# Patient Record
Sex: Male | Born: 1955 | Race: Black or African American | Hispanic: No | Marital: Single | Smoking: Current every day smoker
Health system: Southern US, Community
[De-identification: ages and names within clinical notes are randomized; demographics above are authoritative.]

## PROBLEM LIST (undated history)

## (undated) DIAGNOSIS — S92309A Fracture of unspecified metatarsal bone(s), unspecified foot, initial encounter for closed fracture: Secondary | ICD-10-CM

## (undated) DIAGNOSIS — R350 Frequency of micturition: Secondary | ICD-10-CM

## (undated) DIAGNOSIS — I700 Atherosclerosis of aorta: Secondary | ICD-10-CM

## (undated) DIAGNOSIS — I10 Essential (primary) hypertension: Secondary | ICD-10-CM

## (undated) DIAGNOSIS — F329 Major depressive disorder, single episode, unspecified: Secondary | ICD-10-CM

## (undated) DIAGNOSIS — A159 Respiratory tuberculosis unspecified: Secondary | ICD-10-CM

## (undated) DIAGNOSIS — N400 Enlarged prostate without lower urinary tract symptoms: Secondary | ICD-10-CM

## (undated) DIAGNOSIS — M1990 Unspecified osteoarthritis, unspecified site: Secondary | ICD-10-CM

## (undated) DIAGNOSIS — F32A Depression, unspecified: Secondary | ICD-10-CM

## (undated) DIAGNOSIS — K7460 Unspecified cirrhosis of liver: Secondary | ICD-10-CM

## (undated) HISTORY — PX: OTHER SURGICAL HISTORY: SHX169

## (undated) HISTORY — DX: Essential (primary) hypertension: I10

## (undated) HISTORY — PX: NO PAST SURGERIES: SHX2092

## (undated) HISTORY — DX: Respiratory tuberculosis unspecified: A15.9

## (undated) HISTORY — DX: Major depressive disorder, single episode, unspecified: F32.9

## (undated) HISTORY — DX: Unspecified osteoarthritis, unspecified site: M19.90

## (undated) HISTORY — DX: Depression, unspecified: F32.A

## (undated) HISTORY — DX: Benign prostatic hyperplasia without lower urinary tract symptoms: N40.0

## (undated) HISTORY — DX: Frequency of micturition: R35.0

---

## 2004-04-05 ENCOUNTER — Emergency Department: Payer: Self-pay | Admitting: Emergency Medicine

## 2004-10-19 ENCOUNTER — Emergency Department: Payer: Self-pay | Admitting: Emergency Medicine

## 2004-10-19 ENCOUNTER — Other Ambulatory Visit: Payer: Self-pay

## 2004-10-20 ENCOUNTER — Emergency Department: Payer: Self-pay | Admitting: Emergency Medicine

## 2005-02-06 ENCOUNTER — Emergency Department: Payer: Self-pay | Admitting: Unknown Physician Specialty

## 2010-11-30 ENCOUNTER — Emergency Department: Payer: Self-pay | Admitting: Emergency Medicine

## 2015-07-22 ENCOUNTER — Encounter: Payer: Self-pay | Admitting: Urology

## 2015-07-22 ENCOUNTER — Ambulatory Visit (INDEPENDENT_AMBULATORY_CARE_PROVIDER_SITE_OTHER): Payer: Medicaid Other | Admitting: Urology

## 2015-07-22 VITALS — BP 126/75 | HR 112 | Ht 70.5 in | Wt 153.7 lb

## 2015-07-22 DIAGNOSIS — N401 Enlarged prostate with lower urinary tract symptoms: Secondary | ICD-10-CM | POA: Diagnosis not present

## 2015-07-22 DIAGNOSIS — N138 Other obstructive and reflux uropathy: Secondary | ICD-10-CM | POA: Insufficient documentation

## 2015-07-22 DIAGNOSIS — R350 Frequency of micturition: Secondary | ICD-10-CM | POA: Diagnosis not present

## 2015-07-22 DIAGNOSIS — N471 Phimosis: Secondary | ICD-10-CM | POA: Diagnosis not present

## 2015-07-22 LAB — URINALYSIS, COMPLETE
Bilirubin, UA: NEGATIVE
Glucose, UA: NEGATIVE
Ketones, UA: NEGATIVE
Nitrite, UA: NEGATIVE
PH UA: 5.5 (ref 5.0–7.5)
Protein, UA: NEGATIVE
RBC, UA: NEGATIVE
Specific Gravity, UA: <1.005 — ABNORMAL LOW (ref 1.005–1.030)
Urobilinogen, Ur: 0.2 mg/dL (ref 0.2–1.0)

## 2015-07-22 LAB — MICROSCOPIC EXAMINATION
Bacteria, UA: NONE SEEN
RBC, UA: NONE SEEN /hpf (ref 0–?)

## 2015-07-22 LAB — BLADDER SCAN AMB NON-IMAGING: Scan Result: 236

## 2015-07-22 MED ORDER — FINASTERIDE 5 MG PO TABS: 5.0000 mg | ORAL_TABLET | Freq: Every day | ORAL | Status: DC

## 2015-07-22 MED ORDER — TAMSULOSIN HCL 0.4 MG PO CAPS: 0.4000 mg | ORAL_CAPSULE | Freq: Every day | ORAL | Status: DC

## 2015-07-22 NOTE — Progress Notes (Signed)
07/22/2015 2:26 PM   Patrick [name] Nov 15, 1955 [id number]  Referring provider: No referring provider defined for this encounter.  Chief Complaint  Patient presents with  . Benign Prostatic Hypertrophy    referred by Dr. Moishe Spiceuhl MD  . Urinary Frequency    HPI: This is a 60 year old African-American male who is referred to us by his PCP, Dr. Moishe [name], for BPH with LUTS and urinary frequency.   [name] states that for the last 10 years he has been experiencing urinary frequency, urgency, dysuria, nocturia 4, postvoid dribbling, intermittency, hesitancy and straining to urinate.  He states that he has been on tamsulosin for several years. He stated the tamsulosin was effective at first, but now it is no longer relieving his urinary symptoms. He also admitted to having episodes of urinary retention 2-3 times over the last 10 years which required Foley catheter placement.  He is I PSS score today is 30/6. His UA today was unremarkable. His PVR was 236 mL.  He denied any recent gross hematuria, fever, chills, nausea or vomiting.      IPSS      07/22/15 1400       International Prostate Symptom Score   How often have you had the sensation of not emptying your bladder? Almost always     How often have you had to urinate less than every two hours? More than half the time     How often have you found you stopped and started again several times when you urinated? More than half the time     How often have you found it difficult to postpone urination? More than half the time     How often have you had a weak urinary stream? More than half the time     How often have you had to strain to start urination? Almost always     How many times did you typically get up at night to urinate? 4 Times     Total IPSS Score 30     Quality of Life due to urinary symptoms   If you were to spend the rest of your life with your urinary condition just the way it is now how would you feel about that? Terrible          Score:  1-7 Mild 8-19 Moderate 20-35 Severe   PMH: Past Medical History  Diagnosis Date  . Depression   . BPH (benign prostatic hyperplasia)   . Urinary frequency   . Arthritis   . HTN (hypertension)   . Tuberculosis     Surgical History: Past Surgical History  Procedure Laterality Date  . None      Home Medications:    Medication List       This list is accurate as of: 07/22/15  2:26 PM.  Always use your most recent med list.               ibuprofen 200 MG tablet  Commonly known as:  ADVIL,MOTRIN  Take 200 mg by mouth every 6 (six) hours as needed.     NON FORMULARY  Medication for Arthritis. Not sure of name.     tamsulosin 0.4 MG Caps capsule  Commonly known as:  FLOMAX  Take 0.4 mg by mouth.        Allergies: No Known Allergies  Family History: Family History  Problem Relation Age of Onset  . Hematuria    . Tuberculosis    . Kidney disease Neg  Hx   . Prostate cancer Neg Hx     Social History:  reports that he has been smoking.  He does not have any smokeless tobacco history on file. He reports that he drinks alcohol. He reports that he does not use illicit drugs.  ROS: UROLOGY Frequent Urination?: Yes Hard to postpone urination?: Yes Burning/pain with urination?: Yes Get up at night to urinate?: Yes Leakage of urine?: Yes Urine stream starts and stops?: Yes Trouble starting stream?: Yes Do you have to strain to urinate?: Yes Blood in urine?: No Urinary tract infection?: No Sexually transmitted disease?: No Injury to kidneys or bladder?: No Painful intercourse?: No Weak stream?: No Erection problems?: Yes Penile pain?: Yes  Gastrointestinal Nausea?: No Vomiting?: No Indigestion/heartburn?: No Diarrhea?: No Constipation?: No  Constitutional Fever: No Night sweats?: No Weight loss?: Yes Fatigue?: No  Skin Skin rash/lesions?: No Itching?: No  Eyes Blurred vision?: Yes Double vision?:  Yes  Ears/Nose/Throat Sore throat?: Yes Sinus problems?: No  Hematologic/Lymphatic Swollen glands?: No Easy bruising?: No  Cardiovascular Leg swelling?: No Chest pain?: No  Respiratory Cough?: No Shortness of breath?: No  Endocrine Excessive thirst?: No  Musculoskeletal Back pain?: No Joint pain?: Yes  Neurological Headaches?: Yes Dizziness?: No  Psychologic Depression?: Yes Anxiety?: No  Physical Exam: BP 126/75 mmHg  Pulse 112  Ht 5' 10.5" (1.791 m)  Wt 153 lb 11.2 oz (69.718 kg)  BMI 21.73 kg/m2  Constitutional: Well nourished. Alert and oriented, No acute distress. HEENT: [place] AT, moist mucus membranes. Trachea midline, no masses. Cardiovascular: No clubbing, cyanosis, or edema. Respiratory: Normal respiratory effort, no increased work of breathing. GI: Abdomen is soft, non tender, non distended, no abdominal masses. Liver and spleen not palpable.  No hernias appreciated.  Stool sample for occult testing is not indicated.   GU: No CVA tenderness.  No bladder fullness or masses.  Patient with uncircumcised phallus. Foreskin is retracted with some difficulty. Urethral meatus is patent.  No penile discharge. No penile lesions or rashes. Scrotum without lesions, cysts, rashes and/or edema.  Testicles are located scrotally bilaterally. No masses are appreciated in the testicles. Left and right epididymis are normal. Rectal: Patient with  normal sphincter tone. Anus and perineum without scarring or rashes. No rectal masses are appreciated. Prostate is approximately 70 grams, no nodules are appreciated. Seminal vesicles are normal. Skin: No rashes, bruises or suspicious lesions. Lymph: No cervical or inguinal adenopathy. Neurologic: Grossly intact, no focal deficits, moving all 4 extremities. Psychiatric: Normal mood and affect.  Laboratory Data:  Urinalysis Results for orders placed or performed in visit on 07/22/15  Microscopic Examination  Result Value Ref  Range   WBC, UA 6-10 (A) 0 -  5 /hpf   RBC, UA None seen 0 -  2 /hpf   Epithelial Cells (non renal) 0-10 0 - 10 /hpf   Bacteria, UA None seen None seen/Few  Urinalysis, Complete  Result Value Ref Range   Specific Gravity, UA <1.005 (L) 1.005 - 1.030   pH, UA 5.5 5.0 - 7.5   Color, UA Yellow Yellow   Appearance Ur Clear Clear   Leukocytes, UA 1+ (A) Negative   Protein, UA Negative Negative/Trace   Glucose, UA Negative Negative   Ketones, UA Negative Negative   RBC, UA Negative Negative   Bilirubin, UA Negative Negative   Urobilinogen, Ur 0.2 0.2 - 1.0 mg/dL   Nitrite, UA Negative Negative   Microscopic Examination See below:   BLADDER SCAN AMB NON-IMAGING  Result  Value Ref Range   Scan Result 236     Pertinent Imaging: Results for RITA, PROM (MRN [id number]) as of 07/22/2015 23:23  Ref. Range 07/22/2015 14:52  Scan Result Unknown 236    Assessment & Plan:    1. BPH (benign prostatic hyperplasia) with LUTS:   IPSS score is 30/6.  He will continue the tamsulosin 0.4 mg daily and I will add finasteride 5 mg daily.  He will have a repeat IPSS score and PVR  in 3 months if his PSA returns within normal limits.  - PSA  2. Urinary frequency:   Frequency may be due to the incomplete bladder emptying.  Finasteride is added to his tamsulosin. We will reevaluate in 3 months with I PSS score and PVR if his PSA returns within normal limits.  - Urinalysis, Complete  3. Phimosis:   Patient's foreskin was difficult to retract during the exam. When I did retract the foreskin, urine dribbled onto the floor. The phimosis may be the cause of the postvoid dribbling and may be taking his other urinary symptoms worse. He is desiring a circumcision at this time. I have explained the procedure, risks and what to expect post procedurally, such as pain, penile swelling, infection, bleeding, to the patient.  He voices his understanding and wishes to proceed with the circumcision.   Return for  schedule circumsicion.  These notes generated with voice recognition software. I apologize for typographical errors.  Patrick Cowboy, Patrick [name]  [facility] [address], [address] [place], [place] [address] [phone]

## 2015-07-23 LAB — PSA: Prostate Specific Ag, Serum: 0.4 ng/mL (ref 0.0–4.0)

## 2015-09-02 ENCOUNTER — Ambulatory Visit: Payer: Medicaid Other | Admitting: Urology

## 2015-10-07 ENCOUNTER — Ambulatory Visit: Payer: Medicaid Other | Admitting: Urology

## 2016-12-26 DIAGNOSIS — I700 Atherosclerosis of aorta: Secondary | ICD-10-CM

## 2016-12-26 HISTORY — DX: Atherosclerosis of aorta: I70.0

## 2017-01-16 ENCOUNTER — Ambulatory Visit
Admission: RE | Admit: 2017-01-16 | Discharge: 2017-01-16 | Disposition: A | Discharge status: Home / Self Care | Payer: Medicaid Other | Source: Ambulatory Visit | Attending: Gastroenterology | Admitting: Gastroenterology

## 2017-01-16 ENCOUNTER — Other Ambulatory Visit: Payer: Self-pay | Admitting: Gastroenterology

## 2017-01-16 DIAGNOSIS — R7989 Other specified abnormal findings of blood chemistry: Secondary | ICD-10-CM

## 2017-01-16 DIAGNOSIS — R140 Abdominal distension (gaseous): Secondary | ICD-10-CM

## 2017-01-16 DIAGNOSIS — R945 Abnormal results of liver function studies: Secondary | ICD-10-CM | POA: Diagnosis present

## 2017-01-16 DIAGNOSIS — K760 Fatty (change of) liver, not elsewhere classified: Secondary | ICD-10-CM | POA: Diagnosis not present

## 2017-01-16 DIAGNOSIS — I700 Atherosclerosis of aorta: Secondary | ICD-10-CM | POA: Diagnosis not present

## 2017-01-16 DIAGNOSIS — N329 Bladder disorder, unspecified: Secondary | ICD-10-CM | POA: Insufficient documentation

## 2017-01-16 DIAGNOSIS — R188 Other ascites: Secondary | ICD-10-CM | POA: Diagnosis not present

## 2017-01-16 DIAGNOSIS — R1084 Generalized abdominal pain: Secondary | ICD-10-CM

## 2017-01-16 LAB — POCT I-STAT CREATININE: CREATININE: 0.6 mg/dL — AB (ref 0.61–1.24)

## 2017-01-16 MED ORDER — IOPAMIDOL (ISOVUE-300) INJECTION 61%
100.0000 mL | Freq: Once | INTRAVENOUS | Status: AC | PRN
  Administered 2017-01-16: 100 mL via INTRAVENOUS

## 2017-02-10 ENCOUNTER — Ambulatory Visit (INDEPENDENT_AMBULATORY_CARE_PROVIDER_SITE_OTHER): Payer: Medicaid Other | Admitting: Urology

## 2017-02-10 ENCOUNTER — Ambulatory Visit: Payer: Self-pay | Admitting: Urology

## 2017-02-10 ENCOUNTER — Ambulatory Visit
Admission: RE | Admit: 2017-02-10 | Discharge: 2017-02-10 | Disposition: A | Discharge status: Home / Self Care | Payer: Medicaid Other | Source: Ambulatory Visit | Attending: Urology | Admitting: Urology

## 2017-02-10 ENCOUNTER — Encounter: Payer: Self-pay | Admitting: Urology

## 2017-02-10 VITALS — BP 144/72 | HR 88 | Ht 70.0 in | Wt 169.0 lb

## 2017-02-10 DIAGNOSIS — R310 Gross hematuria: Secondary | ICD-10-CM | POA: Diagnosis not present

## 2017-02-10 DIAGNOSIS — N3289 Other specified disorders of bladder: Secondary | ICD-10-CM | POA: Diagnosis not present

## 2017-02-10 DIAGNOSIS — N138 Other obstructive and reflux uropathy: Secondary | ICD-10-CM

## 2017-02-10 DIAGNOSIS — N401 Enlarged prostate with lower urinary tract symptoms: Secondary | ICD-10-CM | POA: Diagnosis present

## 2017-02-10 DIAGNOSIS — R339 Retention of urine, unspecified: Secondary | ICD-10-CM | POA: Diagnosis not present

## 2017-02-10 LAB — URINALYSIS, COMPLETE (UACMP) WITH MICROSCOPIC
Bacteria, UA: NONE SEEN
GLUCOSE, UA: NEGATIVE mg/dL
HGB URINE DIPSTICK: NEGATIVE
KETONES UR: NEGATIVE mg/dL
NITRITE: NEGATIVE
PROTEIN: NEGATIVE mg/dL
RBC / HPF: NONE SEEN RBC/hpf (ref 0–5)
Specific Gravity, Urine: 1.015 (ref 1.005–1.030)
pH: 7 (ref 5.0–8.0)

## 2017-02-10 MED ORDER — FINASTERIDE 5 MG PO TABS: 5.0000 mg | ORAL_TABLET | Freq: Every day | ORAL | 11 refills | Status: AC

## 2017-02-10 MED ORDER — TAMSULOSIN HCL 0.4 MG PO CAPS: 0.4000 mg | ORAL_CAPSULE | Freq: Every day | ORAL | 12 refills | Status: AC

## 2017-02-10 MED ORDER — FINASTERIDE 5 MG PO TABS: 5.0000 mg | ORAL_TABLET | Freq: Every day | ORAL | 11 refills | Status: DC

## 2017-02-10 NOTE — Progress Notes (Signed)
02/10/2017 12:59 PM   Patrick [name] 1955/11/11 [id number]  Referring provider: [facility] [address] [place], [place] [id number]  Chief Complaint  Patient presents with  . Benign Prostatic Hypertrophy    follow up    HPI: 61 year old male previously followed by urology who was referred back to our office for concerning findings on CT scan.   He was recently seen and evaluated by gastroenterology for elevated LFTs.  As part of this work up, he underwent CT abdomen with and without contrast which showed morphological changes consistent with cirrhosis.  Incidentally, the patient was found to have a markedly thickened bladder with 2 diverticula.  1 of these diverticula contained a nodular calcified lesion highly concerning for underlying mass.  He was previously seen in our clinic in 06/2015 by Patrick [name].  At that time, he was noted to have BPH with lower urinary tract symptoms.  He also had incomplete bladder emptying with an elevated postvoid residual of 236 mL.  He is also had episodes of urinary retention in the past.  He was started on tried in addition to the next.  Follow-up was scheduled for in 3 months but he failed to follow-up.  He also has a history of phimosis.  He reports today that he has significant voiding symptoms including a slow urinary stream and difficulty emptying his bladder.  IPSS as below.  He is no longer taking either of the medications, Flomax or finasteride.   He does note that he had an episode of painless gross hematuria yesterday.  No dysuria.  Most recent PSA 06/2015 0.4.  Catheterized PVR 200 cc.  Bladder scan unreliable due to cirrhosis.     IPSS    Row Name 02/10/17 1300         International Prostate Symptom Score   How often have you had the sensation of not emptying your bladder?  More than half the time     How often have you had to urinate less than every two hours?  Almost always     How often have you  found you stopped and started again several times when you urinated?  About half the time     How often have you found it difficult to postpone urination?  Almost always     How often have you had a weak urinary stream?  Almost always     How often have you had to strain to start urination?  Almost always     How many times did you typically get up at night to urinate?  4 Times     Total IPSS Score  31       Quality of Life due to urinary symptoms   If you were to spend the rest of your life with your urinary condition just the way it is now how would you feel about that?  Terrible        Score:  1-7 Mild 8-19 Moderate 20-35 Severe  PMH: Past Medical History:  Diagnosis Date  . Arthritis   . BPH (benign prostatic hyperplasia)   . Depression   . HTN (hypertension)   . Tuberculosis   . Urinary frequency     Surgical History: Past Surgical History:  Procedure Laterality Date  . None      Home Medications:  Allergies as of 02/10/2017   No Known Allergies     Medication List        Accurate as of 02/10/17 11:59  PM. Always use your most recent med list.          finasteride 5 MG tablet Commonly known as:  PROSCAR Take 1 tablet (5 mg total) daily by mouth.   pantoprazole 40 MG tablet Commonly known as:  PROTONIX Take by mouth.   tamsulosin 0.4 MG Caps capsule Commonly known as:  FLOMAX Take 1 capsule (0.4 mg total) daily after breakfast by mouth.       Allergies: No Known Allergies  Family History: Family History  Problem Relation Age of Onset  . Hematuria Unknown   . Tuberculosis Unknown   . Kidney disease Neg Hx   . Prostate cancer Neg Hx     Social History:  reports that he has been smoking.  he has never used smokeless tobacco. He reports that he drinks alcohol. He reports that he does not use drugs.  ROS: UROLOGY Frequent Urination?: Yes Hard to postpone urination?: Yes Burning/pain with urination?: Yes Get up at night to urinate?:  Yes Leakage of urine?: Yes Urine stream starts and stops?: Yes Trouble starting stream?: Yes Do you have to strain to urinate?: Yes Blood in urine?: Yes Urinary tract infection?: No Sexually transmitted disease?: No Injury to kidneys or bladder?: No Painful intercourse?: No Weak stream?: No Erection problems?: No Penile pain?: Yes  Gastrointestinal Nausea?: No Vomiting?: No Indigestion/heartburn?: Yes Diarrhea?: No Constipation?: Yes  Constitutional Fever: No Night sweats?: No Weight loss?: No Fatigue?: Yes  Skin Skin rash/lesions?: No Itching?: No  Eyes Blurred vision?: Yes Double vision?: No  Ears/Nose/Throat Sore throat?: No Sinus problems?: No  Hematologic/Lymphatic Swollen glands?: No Easy bruising?: No  Cardiovascular Leg swelling?: Yes Chest pain?: No  Respiratory Cough?: No Shortness of breath?: Yes  Endocrine Excessive thirst?: Yes  Musculoskeletal Back pain?: Yes Joint pain?: Yes  Neurological Headaches?: No Dizziness?: Yes  Psychologic Depression?: Yes Anxiety?: No  Physical Exam: BP (!) 144/72   Pulse 88   Ht 5\' 10"  (1.778 m)   Wt 169 lb (76.7 kg)   BMI 24.25 kg/m    Constitutional:  Alert and oriented, No acute distress.  Accompanied today by sister-in-law. HEENT: [place] AT, moist mucus membranes.  Trachea midline, no masses.  He is very hard of hearing.  Scleral icterus appreciated. Cardiovascular: No clubbing, cyanosis, or edema. Respiratory: Normal respiratory effort, no increased work of breathing. GI: Abdomen is soft, nontender, no abdominal masses mildly distended.. GU: No CVA tenderness.  No suprapubic tenderness. Rectal: Enlarged, 50 cc prostate, nontender, no nodules. Skin: No rashes, bruises or suspicious lesions. Neurologic: Grossly intact, no focal deficits, moving all 4 extremities. Psychiatric: Normal mood and affect.  Laboratory Data: Lab Results  Component Value Date   CREATININE 0.60 (L) 01/16/2017     Lab Results  Component Value Date   PSA1 0.4 07/22/2015   Urinalysis Pending  Pertinent Imaging: CLINICAL DATA:  62 year old male with history of abdominal pain common nausea, vomiting and abdominal distention. Elevated liver function tests.  EXAM: CT ABDOMEN AND PELVIS WITHOUT AND WITH CONTRAST  TECHNIQUE: Multidetector CT imaging of the abdomen and pelvis was performed following the standard protocol before and following the bolus administration of intravenous contrast.  CONTRAST:  ISOVUE-300 IOPAMIDOL (ISOVUE-300) INJECTION 61%  COMPARISON:  No priors.  FINDINGS: Comment: Today's study is limited by considerable patient respiratory motion.  Lower chest: Unremarkable.  Hepatobiliary: Mild diffuse low attenuation throughout the hepatic parenchyma, indicative of a background of hepatic steatosis. Accounting for today's motion limited examination, no definite suspicious cystic  or solid hepatic lesions are noted. The liver has a shrunken appearance and nodular contour, indicative of underlying cirrhosis. No intra or extrahepatic biliary ductal dilatation. Gallbladder is normal in appearance.  Pancreas: No pancreatic mass. No pancreatic ductal dilatation. No pancreatic or peripancreatic fluid or inflammatory changes.  Spleen: Unremarkable.  Adrenals/Urinary Tract: Bilateral kidneys and bilateral adrenal glands are normal in appearance. No hydroureteronephrosis. Urinary bladder is thick walled. Large diverticulum adjacent to the left ureterovesicular junction measuring up to 3.6 cm in diameter, with a small focus of mural thickening (axial image 125 of series 7) laterally. Small diverticulum in the superior aspect of the urinary bladder measuring 2.7 cm in diameter. Additionally, on the left side of the urinary bladder there is a small mural nodule measuring approximately 11 mm which has a small focus of calcification associated with it (axial  image 118 of series 7).  Stomach/Bowel: The appearance of the stomach is normal. There is no pathologic dilatation of small bowel or colon. The appendix is not confidently identified and may be surgically absent. Regardless, there are no inflammatory changes noted adjacent to the cecum to suggest the presence of an acute appendicitis at this time.  Vascular/Lymphatic: Aortic atherosclerosis, without evidence of aneurysm or dissection in the abdominal or pelvic vasculature. No lymphadenopathy noted in the abdomen or pelvis.  Reproductive: Severe median lobe hypertrophy of the prostate gland. Seminal vesicles are unremarkable in appearance.  Other: Small volume of ascites, most evident adjacent to the liver and spleen. No pneumoperitoneum.  Musculoskeletal: There are no aggressive appearing lytic or blastic lesions noted in the visualized portions of the skeleton.  IMPRESSION: 1. Morphologic changes in the liver compatible with underlying cirrhosis. There is also some mild hepatic steatosis. At this time, there is no discrete hepatic lesion identified. Additionally, there is no evidence of biliary tract obstruction. 2. Small volume of ascites. 3. Aortic atherosclerosis. 4. Markedly thickened urinary bladder, with 2 diverticulae and 1 focus of mural nodularity which appears to have some internal calcification, as above. Urologic consultation is recommended in the near future to better evaluate these findings, as a possibility of urothelial neoplasm is not excluded. Aortic Atherosclerosis (ICD10-I70.0).   Electronically Signed   By: Trudie Reedaniel  Entrikin M.D.   On: 01/16/2017 14:41  CT scan was personally reviewed today.    Assessment & Plan:    1. Bladder mass CT scan reviewed, highly concerning for underlying bladder malignancy Findings were reviewed with the patient and his sister-in-law  recommend cystoscopy for direct visualization  2. Gross hematuria Highly  concerning especially especially in setting of #1 UA today unremarkable  3. BPH with urinary obstruction Sequela of chronic outlet obstruction seen on CT scan as well as incomplete bladder emptying today Symptomatic Resume finasteride and Flomax - BLADDER SCAN AMB NON-IMAGING - Urinalysis, Complete w Microscopic; Future - tamsulosin (FLOMAX) 0.4 MG CAPS capsule; Take 1 capsule (0.4 mg total) daily after breakfast by mouth.  Dispense: 30 capsule; Refill: 12 - finasteride (PROSCAR) 5 MG tablet; Take 1 tablet (5 mg total) daily by mouth.  Dispense: 30 tablet; Refill: 11  4. Incomplete bladder emptying Catheterized for PVR today  Encourage timed double voiding  All the above is complicated today by the fact that he continues to abuse alcohol and has evidence of cirrhosis.  Is also failed to follow-up in the past as previously recommended and stopped taking his BPH meds.  We had a lengthy discussion today about compliance with follow-up as well as my concern for  underlying malignancy.  He understands this and is agreed to come back for cystoscopy as discussed.  Return to care in 2 weeks for cystoscopy   Vanna Scotland, MD  [facility] [address], [address] [place], [place] [address] [phone]  I spent 25 min with this patient of which greater than 50% was spent in counseling and coordination of care with the patient.

## 2017-02-10 NOTE — Progress Notes (Signed)
In and Out Catheterization  Patient is present today for a I & O catheterization due to unable to void. Patient was cleaned and prepped in a sterile fashion with betadine and Lidocaine 2% jelly was instilled into the urethra.  A 14FR coude cath was inserted no complications were noted , 200ml of urine return was noted, urine was dark orange in color. A clean urine sample was collected for UA. Bladder was drained  And catheter was removed with out difficulty.    Preformed by: Eligha BridegroomSarah Jacobb Alen, CMA

## 2017-02-23 ENCOUNTER — Other Ambulatory Visit: Payer: Self-pay

## 2017-02-23 DIAGNOSIS — N3289 Other specified disorders of bladder: Secondary | ICD-10-CM

## 2017-02-24 ENCOUNTER — Encounter: Payer: Self-pay | Admitting: Urology

## 2017-02-24 ENCOUNTER — Ambulatory Visit (INDEPENDENT_AMBULATORY_CARE_PROVIDER_SITE_OTHER): Payer: Medicaid Other | Admitting: Urology

## 2017-02-24 VITALS — BP 134/69 | HR 102 | Ht 70.0 in | Wt 164.0 lb

## 2017-02-24 DIAGNOSIS — N138 Other obstructive and reflux uropathy: Secondary | ICD-10-CM

## 2017-02-24 DIAGNOSIS — N3289 Other specified disorders of bladder: Secondary | ICD-10-CM

## 2017-02-24 DIAGNOSIS — N401 Enlarged prostate with lower urinary tract symptoms: Secondary | ICD-10-CM

## 2017-02-24 DIAGNOSIS — R310 Gross hematuria: Secondary | ICD-10-CM

## 2017-02-24 MED ORDER — CIPROFLOXACIN HCL 500 MG PO TABS
500.0000 mg | ORAL_TABLET | Freq: Once | ORAL | Status: AC
  Administered 2017-02-24: 500 mg via ORAL

## 2017-02-24 MED ORDER — LIDOCAINE HCL 2 % EX GEL
1.0000 "application " | Freq: Once | CUTANEOUS | Status: AC
  Administered 2017-02-24: 1 via URETHRAL

## 2017-02-24 NOTE — Progress Notes (Signed)
   02/24/17  CC:  Chief Complaint  Patient presents with  . Cysto    HPI: 61 year old male with history of alcoholism/cirrhosis found to have abnormal bladder on CT abdomen pelvis.  He is also had episodes of painless gross hematuria.  He presents today for cystoscopy for further evaluation of these bladder lesions.  Blood pressure 134/69, pulse (!) 102, height 5\' 10"  (1.778 m), weight 164 lb (74.4 kg). NED. A&Ox3.   No respiratory distress   Abd soft, NT, ND Normal phallus with bilateral descended testicles  Cystoscopy Procedure Note  Patient identification was confirmed, informed consent was obtained, and patient was prepped using Betadine solution.  Lidocaine jelly was administered per urethral meatus.    Preoperative abx where received prior to procedure.     Pre-Procedure: - Inspection reveals a normal caliber ureteral meatus.  Procedure: The flexible cystoscope was introduced without difficulty.  Initially, visualization was very poor due to a large amount of debris within the bladder requiring bladder drainage and refilling several times. - No urethral strictures/lesions are present. - Enlarged prostate with friable mucosa - Elevated bladder neck - Bilateral ureteral orifices identified - Bladder mucosa  reveals several ulcerated, erythematous  appearing concerning lesions on the posterior bladder wall, left and right bladder wall.  Again, visualization was somewhat limited today.  No obvious discrete papillary tumors were identified. - No bladder stones - Moderate trabeculation with diverticula.   Retroflexion shows intravesical protrusion of the median lobe which was irregular in appearance, hypovascular, edematous and multilobulated.   Post-Procedure: - Patient tolerated the procedure well  Assessment/ Plan:  1. Bladder mass Suspicious and concerning findings today on cystoscopy Recommend biopsy/TURBT in the operating room for further evaluation including  bilateral retrograde pyelogram to complete gross hematuria workup risk and benefits of the surgery were discussed in detail including risk of bleeding, infection, damage to surrounding structures, injury to bladder, amongst others All questions answered - ciprofloxacin (CIPRO) tablet 500 mg - lidocaine (XYLOCAINE) 2 % jelly 1 application  2. BPH with obstruction/lower urinary tract symptoms BPH with chronic outlet obstruction with sequela including incomplete bladder emptying and trabeculation with diverticula I recommended consideration of the time of  bladder surgery to reduce outlet depending on extent of bladder lesion Reviewed the risks of TURP including bleeding, infection, retrograde ejaculation, stress incontinence, need for overnight admission.  3. Gross hematuria As per #1   Vanna ScotlandAshley Arria Naim, MD

## 2017-03-02 ENCOUNTER — Other Ambulatory Visit: Payer: Self-pay | Admitting: Urology

## 2017-03-02 ENCOUNTER — Other Ambulatory Visit: Payer: Self-pay | Admitting: Radiology

## 2017-03-02 DIAGNOSIS — N401 Enlarged prostate with lower urinary tract symptoms: Secondary | ICD-10-CM

## 2017-03-02 DIAGNOSIS — N138 Other obstructive and reflux uropathy: Secondary | ICD-10-CM

## 2017-03-02 DIAGNOSIS — N3289 Other specified disorders of bladder: Secondary | ICD-10-CM

## 2017-03-02 DIAGNOSIS — R310 Gross hematuria: Secondary | ICD-10-CM

## 2017-03-08 ENCOUNTER — Encounter: Payer: Self-pay | Admitting: Emergency Medicine

## 2017-03-08 ENCOUNTER — Emergency Department: Payer: Medicaid Other

## 2017-03-08 ENCOUNTER — Encounter
Admission: RE | Admit: 2017-03-08 | Discharge: 2017-03-08 | Disposition: A | Discharge status: Home / Self Care | Payer: Medicaid Other | Source: Ambulatory Visit | Attending: Urology | Admitting: Urology

## 2017-03-08 ENCOUNTER — Other Ambulatory Visit: Payer: Self-pay

## 2017-03-08 ENCOUNTER — Encounter: Payer: Self-pay | Admitting: Anesthesiology

## 2017-03-08 ENCOUNTER — Inpatient Hospital Stay
Admission: EM | Admit: 2017-03-08 | Discharge: 2017-03-09 | DRG: 434 | Disposition: A | Discharge status: Home / Self Care | Payer: Medicaid Other | Attending: Internal Medicine | Admitting: Internal Medicine

## 2017-03-08 ENCOUNTER — Ambulatory Visit (INDEPENDENT_AMBULATORY_CARE_PROVIDER_SITE_OTHER): Payer: Medicaid Other

## 2017-03-08 VITALS — BP 131/76 | HR 94

## 2017-03-08 DIAGNOSIS — I10 Essential (primary) hypertension: Secondary | ICD-10-CM | POA: Insufficient documentation

## 2017-03-08 DIAGNOSIS — Z8611 Personal history of tuberculosis: Secondary | ICD-10-CM | POA: Diagnosis not present

## 2017-03-08 DIAGNOSIS — Z8249 Family history of ischemic heart disease and other diseases of the circulatory system: Secondary | ICD-10-CM | POA: Diagnosis not present

## 2017-03-08 DIAGNOSIS — F329 Major depressive disorder, single episode, unspecified: Secondary | ICD-10-CM | POA: Diagnosis present

## 2017-03-08 DIAGNOSIS — I700 Atherosclerosis of aorta: Secondary | ICD-10-CM | POA: Diagnosis present

## 2017-03-08 DIAGNOSIS — Z7983 Long term (current) use of bisphosphonates: Secondary | ICD-10-CM

## 2017-03-08 DIAGNOSIS — R945 Abnormal results of liver function studies: Secondary | ICD-10-CM | POA: Diagnosis not present

## 2017-03-08 DIAGNOSIS — R17 Unspecified jaundice: Secondary | ICD-10-CM | POA: Diagnosis present

## 2017-03-08 DIAGNOSIS — N329 Bladder disorder, unspecified: Secondary | ICD-10-CM | POA: Diagnosis present

## 2017-03-08 DIAGNOSIS — N401 Enlarged prostate with lower urinary tract symptoms: Secondary | ICD-10-CM | POA: Diagnosis present

## 2017-03-08 DIAGNOSIS — F17200 Nicotine dependence, unspecified, uncomplicated: Secondary | ICD-10-CM | POA: Diagnosis present

## 2017-03-08 DIAGNOSIS — K7031 Alcoholic cirrhosis of liver with ascites: Secondary | ICD-10-CM | POA: Diagnosis present

## 2017-03-08 DIAGNOSIS — N138 Other obstructive and reflux uropathy: Secondary | ICD-10-CM | POA: Diagnosis present

## 2017-03-08 DIAGNOSIS — Z79899 Other long term (current) drug therapy: Secondary | ICD-10-CM | POA: Diagnosis not present

## 2017-03-08 DIAGNOSIS — R109 Unspecified abdominal pain: Secondary | ICD-10-CM | POA: Diagnosis not present

## 2017-03-08 HISTORY — DX: Unspecified cirrhosis of liver: K74.60

## 2017-03-08 HISTORY — DX: Atherosclerosis of aorta: I70.0

## 2017-03-08 HISTORY — DX: Fracture of unspecified metatarsal bone(s), unspecified foot, initial encounter for closed fracture: S92.309A

## 2017-03-08 LAB — CBC
HCT: 38 % — ABNORMAL LOW (ref 40.0–52.0)
HCT: 37.8 % — ABNORMAL LOW (ref 40.0–52.0)
HEMOGLOBIN: 13.1 g/dL (ref 13.0–18.0)
Hemoglobin: 13.1 g/dL (ref 13.0–18.0)
MCH: 34.3 pg — ABNORMAL HIGH (ref 26.0–34.0)
MCH: 34.6 pg — AB (ref 26.0–34.0)
MCHC: 34.6 g/dL (ref 32.0–36.0)
MCHC: 34.7 g/dL (ref 32.0–36.0)
MCV: 99.4 fL (ref 80.0–100.0)
MCV: 99.7 fL (ref 80.0–100.0)
PLATELETS: 207 10*3/uL (ref 150–440)
Platelets: 209 10*3/uL (ref 150–440)
RBC: 3.82 MIL/uL — ABNORMAL LOW (ref 4.40–5.90)
RBC: 3.79 MIL/uL — ABNORMAL LOW (ref 4.40–5.90)
RDW: 15.2 % — AB (ref 11.5–14.5)
RDW: 15 % — AB (ref 11.5–14.5)
WBC: 8 10*3/uL (ref 3.8–10.6)
WBC: 7 10*3/uL (ref 3.8–10.6)

## 2017-03-08 LAB — COMPREHENSIVE METABOLIC PANEL
ALBUMIN: 2.7 g/dL — AB (ref 3.5–5.0)
ALK PHOS: 913 U/L — AB (ref 38–126)
ALT: 50 U/L (ref 17–63)
ANION GAP: 7 (ref 5–15)
AST: 93 U/L — AB (ref 15–41)
BUN: 11 mg/dL (ref 6–20)
CALCIUM: 8.8 mg/dL — AB (ref 8.9–10.3)
CO2: 25 mmol/L (ref 22–32)
Chloride: 99 mmol/L — ABNORMAL LOW (ref 101–111)
Creatinine, Ser: 0.55 mg/dL — ABNORMAL LOW (ref 0.61–1.24)
GFR calc Af Amer: >60 mL/min (ref >60)
GFR calc non Af Amer: >60 mL/min (ref >60)
GLUCOSE: 139 mg/dL — AB (ref 65–99)
Potassium: 3.7 mmol/L (ref 3.5–5.1)
SODIUM: 131 mmol/L — AB (ref 135–145)
Total Bilirubin: 6.3 mg/dL — ABNORMAL HIGH (ref 0.3–1.2)
Total Protein: 7.3 g/dL (ref 6.5–8.1)

## 2017-03-08 LAB — URINALYSIS, ROUTINE W REFLEX MICROSCOPIC
Bilirubin Urine: NEGATIVE
GLUCOSE, UA: NEGATIVE mg/dL
KETONES UR: NEGATIVE mg/dL
Nitrite: NEGATIVE
PH: 5 (ref 5.0–8.0)
Protein, ur: NEGATIVE mg/dL
SPECIFIC GRAVITY, URINE: 1.005 (ref 1.005–1.030)

## 2017-03-08 LAB — BODY FLUID CELL COUNT WITH DIFFERENTIAL
Eos, Fluid: 0 %
LYMPHS FL: 34 %
MONOCYTE-MACROPHAGE-SEROUS FLUID: 51 %
Neutrophil Count, Fluid: 15 %
Other Cells, Fluid: 0 %
WBC FLUID: 573 uL

## 2017-03-08 LAB — PROTIME-INR
INR: 1.14
INR: 1.14
PROTHROMBIN TIME: 14.5 s (ref 11.4–15.2)
PROTHROMBIN TIME: 14.5 s (ref 11.4–15.2)

## 2017-03-08 LAB — PROTEIN, PLEURAL OR PERITONEAL FLUID

## 2017-03-08 LAB — LIPASE, BLOOD: Lipase: 14 U/L (ref 11–51)

## 2017-03-08 LAB — GLUCOSE, PLEURAL OR PERITONEAL FLUID: Glucose, Fluid: 132 mg/dL

## 2017-03-08 MED ORDER — FINASTERIDE 5 MG PO TABS
5.0000 mg | ORAL_TABLET | Freq: Every day | ORAL | Status: DC
  Administered 2017-03-09: 5 mg via ORAL
  Filled 2017-03-08: qty 1

## 2017-03-08 MED ORDER — PANTOPRAZOLE SODIUM 40 MG PO TBEC
40.0000 mg | DELAYED_RELEASE_TABLET | Freq: Every day | ORAL | Status: DC
  Administered 2017-03-09: 40 mg via ORAL
  Filled 2017-03-08: qty 1

## 2017-03-08 MED ORDER — ONDANSETRON HCL 4 MG PO TABS: 4.0000 mg | ORAL_TABLET | Freq: Four times a day (QID) | ORAL | Status: DC | PRN

## 2017-03-08 MED ORDER — ENOXAPARIN SODIUM 40 MG/0.4ML ~~LOC~~ SOLN: 40.0000 mg | SUBCUTANEOUS | Status: DC

## 2017-03-08 MED ORDER — TAMSULOSIN HCL 0.4 MG PO CAPS
0.4000 mg | ORAL_CAPSULE | Freq: Every day | ORAL | Status: DC
  Administered 2017-03-09: 0.4 mg via ORAL
  Filled 2017-03-08: qty 1

## 2017-03-08 MED ORDER — ONDANSETRON HCL 4 MG/2ML IJ SOLN: 4.0000 mg | Freq: Four times a day (QID) | INTRAMUSCULAR | Status: DC | PRN

## 2017-03-08 MED ORDER — ALBUMIN HUMAN 25 % IV SOLN
12.5000 g | Freq: Once | INTRAVENOUS | Status: AC
  Administered 2017-03-08: 12.5 g via INTRAVENOUS
  Filled 2017-03-08: qty 50

## 2017-03-08 NOTE — ED Notes (Signed)
Called pharmacy to obtain albumin and advised that it was ready just someone needed to come and pick it up

## 2017-03-08 NOTE — ED Provider Notes (Signed)
[facility] Emergency Department Provider Note    None    (approximate)  I have reviewed the triage vital signs and the nursing notes.   HISTORY  Chief Complaint Abdominal Pain    HPI Patrick [name] is a 61 y.o. male with recent diagnosis of alcoholic cirrhosis of the liver presents to the ER due to worsening diffuse abdominal pain and distention.  States he is having difficulty breathing due to distended abdomen.  States his been swelling over the past several days.  States that he is not made any medication changes.  Denies any fevers.  No cough.  Patient was sent to the ER after is being seen in his urology clinic appointment for preop labs.  Denies any dysuria.  States that he still making urine.  Describes the pain is mild to moderate in severity  Past Medical History:  Diagnosis Date  . Aortic atherosclerosis (HCC) 12/2016  . Arthritis   . BPH (benign prostatic hyperplasia)   . Cirrhosis of liver (HCC)   . Depression   . HTN (hypertension)   . Metatarsal fracture    2nd toe of left foot  . Tuberculosis   . Urinary frequency    Family History  Problem Relation Age of Onset  . Hematuria Unknown   . Tuberculosis Unknown   . Diabetes Mother   . Hypertension Mother   . Kidney disease Neg Hx   . Prostate cancer Neg Hx    Past Surgical History:  Procedure Laterality Date  . NO PAST SURGERIES    . None     Patient Active Problem List   Diagnosis Date Noted  . BPH with obstruction/lower urinary tract symptoms 07/22/2015  . Urinary frequency 07/22/2015  . Phimosis 07/22/2015      Prior to Admission medications   Medication Sig Start Date End Date Taking? Authorizing Provider  finasteride (PROSCAR) 5 MG tablet Take 1 tablet (5 mg total) daily by mouth. 02/10/17   Vanna ScotlandBrandon, Ashley, MD  pantoprazole (PROTONIX) 40 MG tablet Take 40 mg by mouth daily.  01/16/17   [provider]  tamsulosin (FLOMAX) 0.4 MG CAPS capsule Take 1 capsule  (0.4 mg total) daily after breakfast by mouth. 02/10/17   Vanna ScotlandBrandon, Ashley, MD    Allergies Patient has no known allergies.    Social History Social History   Tobacco Use  . Smoking status: Current Every Day Smoker    Packs/day: 0.50  . Smokeless tobacco: Never Used  Substance Use Topics  . Alcohol use: Yes    Alcohol/week: 0.0 oz    Comment: daily use up until October 2018  . Drug use: No    Review of Systems Patient denies headaches, rhinorrhea, blurry vision, numbness, shortness of breath, chest pain, edema, cough, abdominal pain, nausea, vomiting, diarrhea, dysuria, fevers, rashes or hallucinations unless otherwise stated above in HPI. ____________________________________________   PHYSICAL EXAM:  VITAL SIGNS: Vitals:   03/08/17 2100 03/08/17 2130  BP: 139/72 135/82  Pulse: 86 90  Resp:    Temp:    SpO2: 100% 100%    Constitutional: Alert and oriented. in no acute distress. Eyes:scleral icterus  Head: Atraumatic. Nose: No congestion/rhinnorhea. Mouth/Throat: Mucous membranes are moist.   Neck: No stridor. Painless ROM.  Cardiovascular: Normal rate, regular rhythm. Grossly normal heart sounds.  Good peripheral circulation. Respiratory: Normal respiratory effort.  No retractions. Lungs CTAB. Gastrointestinal: Tense ascites with positive fluid wave. No abdominal bruits. No CVA tenderness. Genitourinary:  Musculoskeletal: No lower extremity  tenderness nor edema.  No joint effusions. Neurologic:  Normal speech and language. No gross focal neurologic deficits are appreciated. No facial droop Skin:  Skin is warm, dry and intact. No rash noted. Psychiatric: Mood and affect are normal. Speech and behavior are normal.  ____________________________________________   LABS (all labs ordered are listed, but only abnormal results are displayed)  Results for orders placed or performed during the hospital encounter of 03/08/17 (from the past 24 hour(s))  Lipase, blood      Status: None   Collection Time: 03/08/17  3:57 PM  Result Value Ref Range   Lipase 14 11 - 51 U/L  Comprehensive metabolic panel     Status: Abnormal   Collection Time: 03/08/17  3:57 PM  Result Value Ref Range   Sodium 131 (L) 135 - 145 mmol/L   Potassium 3.7 3.5 - 5.1 mmol/L   Chloride 99 (L) 101 - 111 mmol/L   CO2 25 22 - 32 mmol/L   Glucose, Bld 139 (H) 65 - 99 mg/dL   BUN 11 6 - 20 mg/dL   Creatinine, Ser 1.61 (L) 0.61 - 1.24 mg/dL   Calcium 8.8 (L) 8.9 - 10.3 mg/dL   Total Protein 7.3 6.5 - 8.1 g/dL   Albumin 2.7 (L) 3.5 - 5.0 g/dL   AST 93 (H) 15 - 41 U/L   ALT 50 17 - 63 U/L   Alkaline Phosphatase 913 (H) 38 - 126 U/L   Total Bilirubin 6.3 (H) 0.3 - 1.2 mg/dL   GFR calc non Af Amer >60 >60 mL/min   GFR calc Af Amer >60 >60 mL/min   Anion gap 7 5 - 15  CBC     Status: Abnormal   Collection Time: 03/08/17  3:57 PM  Result Value Ref Range   WBC 7.0 3.8 - 10.6 K/uL   RBC 3.79 (L) 4.40 - 5.90 MIL/uL   Hemoglobin 13.1 13.0 - 18.0 g/dL   HCT 09.6 (L) 04.5 - 40.9 %   MCV 99.7 80.0 - 100.0 fL   MCH 34.6 (H) 26.0 - 34.0 pg   MCHC 34.7 32.0 - 36.0 g/dL   RDW 81.1 (H) 91.4 - 78.2 %   Platelets 209 150 - 440 K/uL  Protime-INR     Status: None   Collection Time: 03/08/17  6:46 PM  Result Value Ref Range   Prothrombin Time 14.5 11.4 - 15.2 seconds   INR 1.14   Body fluid cell count with differential     Status: Abnormal   Collection Time: 03/08/17  7:40 PM  Result Value Ref Range   Fluid Type-FCT PERITONEAL    Color, Fluid YELLOW YELLOW   Appearance, Fluid CLOUDY (A) CLEAR   WBC, Fluid 573 cu mm   Neutrophil Count, Fluid 15 %   Lymphs, Fluid 34 %   Monocyte-Macrophage-Serous Fluid 51 %   Eos, Fluid 0 %   Other Cells, Fluid 0 %  Protein, pleural or peritoneal fluid     Status: None   Collection Time: 03/08/17  7:40 PM  Result Value Ref Range   Total protein, fluid <3.0 g/dL   Fluid Type-FTP PERITONEAL   Glucose, pleural or peritoneal fluid     Status: None    Collection Time: 03/08/17  7:40 PM  Result Value Ref Range   Glucose, Fluid 132 mg/dL   Fluid Type-FGLU peritoneal    ____________________________________________  __________________________________  RADIOLOGY  I personally reviewed all radiographic images ordered to evaluate for the above acute complaints  and reviewed radiology reports and findings.  These findings were personally discussed with the patient.  Please see medical record for radiology report.  ____________________________________________   PROCEDURES  Procedure(s) performed:  ABDOMINAL PARACENTESIS Date/Time: 03/08/2017 9:40 PM Performed by: Willy Eddyobinson, Tamas Suen, MD Authorized by: Willy Eddyobinson, Jessel Gettinger, MD  Consent: Verbal consent obtained. Consent given by: patient Patient understanding: patient states understanding of the procedure being performed Imaging studies: imaging studies available Patient identity confirmed: verbally with patient Time out: Immediately prior to procedure a "time out" was called to verify the correct patient, procedure, equipment, support staff and site/side marked as required. Preparation: Patient was prepped and draped in the usual sterile fashion. Local anesthesia used: yes Anesthesia: local infiltration  Anesthesia: Local anesthesia used: yes Local Anesthetic: lidocaine 1% without epinephrine Anesthetic total: 10 mL Patient tolerance: Patient tolerated the procedure well with no immediate complications Comments: 3.5L of cloudy ascitic fluids removed.       Critical Care performed: no ____________________________________________   INITIAL IMPRESSION / ASSESSMENT AND PLAN / ED COURSE  Pertinent labs & imaging results that were available during my care of the patient were reviewed by me and considered in my medical decision making (see chart for details).  DDX: BP, tense ascites, worsening liver failure, dehydration, hypoalbuminemia, alcohol abuse  Patrick [name] is a 61 y.o.  who presents to the ED with symptoms as described above.  Patient with symptoms most likely secondary to tense new onset ascites in the setting of his liver cirrhosis and worsening pain will perform paracentesis to evaluate for SBP and provide some form of therapeutic relief.  Blood work sent for the above differential shows no evidence of leukocytosis and the patient is afebrile.  Clinical Course as of Mar 08 2145  Wed Mar 08, 2017  1939 Anion gap: 7 [PR]  2023 Centesis completed.  Patient tolerated procedure well and states he feels significantly improved.  Abdomen is soft no longer tender.  Awaiting results of cell count.  [PR]  2123 I spoke with Dr. Adonis Hugueninolley awning of GI.  Based on the patient's relatively quick worsening of his ascites she has recommended observation in the hospital pending culture results as well as ultrasound to evaluate for Budd-Chiari and trending his enzymes.  Will speak with Dr. Anne HahnWillis for admission.  [PR]    Clinical Course User Index [PR] Willy Eddyobinson, Denney Shein, MD     ____________________________________________   FINAL CLINICAL IMPRESSION(S) / ED DIAGNOSES  Final diagnoses:  Elevated bilirubin  Ascites due to alcoholic cirrhosis (HCC)  Jaundice      NEW MEDICATIONS STARTED DURING THIS VISIT:  This SmartLink is deprecated. Use AVSMEDLIST instead to display the medication list for a patient.   Note:  This document was prepared using Dragon voice recognition software and may include unintentional dictation errors.    Willy Eddyobinson, Rosielee Corporan, MD 03/08/17 2147

## 2017-03-08 NOTE — ED Triage Notes (Signed)
Presents with abd distention and intermittent pain

## 2017-03-08 NOTE — Progress Notes (Signed)
Patient here today complaining of stomach discomfort and intermittent lower back pain, some fatigue and dizziness. Patient and caretaker state his stomach is swollen. Upon exam his stomach does appear distended (possible ascites?) and patient states he is very uncomfortable. Per Dr. Apolinar JunesBrandon patient told to follow up with PCP or ER for evaluation today. Patient states PCP cannot see will go to ER for evaluation.

## 2017-03-08 NOTE — Patient Instructions (Signed)
Your procedure is scheduled on: Monday, March 13, 2017 Report to Same Day Surgery on the 2nd floor in the [facility]. To find out your arrival time, please call [phone] between 1PM - 3PM on: Friday, March 10, 2017  REMEMBER: Instructions that are not followed completely may result in serious medical risk, up to and including death; or upon the discretion of your surgeon and anesthesiologist your surgery may need to be rescheduled.  Do not eat food after midnight the night before your procedure.  No gum chewing or hard candies.  You may however, drink CLEAR liquids up to 2 hours before you are scheduled to arrive at the hospital for your procedure.  Do not drink clear liquids within 2 hours of the start of your surgery.  Clear liquids include: - water  - apple juice without pulp - clear gatorade - black coffee or tea (Do NOT add anything to the coffee or tea) Do NOT drink anything that is not on this list.  No Alcohol for 24 hours before or after surgery.  No Smoking including e-cigarettes for 24 hours prior to surgery. No chewable tobacco products for at least 6 hours prior to surgery. No nicotine patches on the day of surgery.  Notify your doctor if there is any change in your medical condition (cold, fever, infection).  Do not wear jewelry, make-up, hairpins, clips or nail polish.  Do not wear lotions, powders, or perfumes. You may wear deodorant.  Do not shave 48 hours prior to surgery. Men may shave face and neck.  Contacts and dentures may not be worn into surgery.  Do not bring valuables to the hospital. [facility] not responsible for any belongings or valuables.   TAKE THESE MEDICATIONS THE MORNING OF SURGERY WITH A SIP OF WATER:  1.  PROTONIX (take one tablet the night before surgery and one tablet the morning of surgery) - helps to prevent nausea after surgery  NOW!  Stop Anti-inflammatories such as Advil, Aleve, Ibuprofen, Motrin, Naproxen,  Naprosyn, Goodie powder, or aspirin products. (May take Tylenol or Acetaminophen if needed.)  NOW!  Stop ANY OVER THE COUNTER supplements until after surgery.   If you are being discharged the day of surgery, you will not be allowed to drive home. You will need someone to drive you home and stay with you that night.   If you are taking public transportation, you will need to have a responsible adult to with you.  Please call the number above if you have any questions about these instructions.

## 2017-03-08 NOTE — ED Notes (Signed)
Pt c/o abd pain that started today and gradually got worse - denies N/V/D

## 2017-03-08 NOTE — Pre-Procedure Instructions (Signed)
Per anesthesia patient has abnormal EKG and requested medical clearance. [facility] to [facility] and left Amy a  Voice message.

## 2017-03-08 NOTE — ED Notes (Signed)
Patient transported to Ultrasound 

## 2017-03-08 NOTE — H&P (Signed)
[facility] [place] [facility]   PATIENT NAME: Patrick [name]    MR#:  [id number]  DATE OF BIRTH:  1955/05/13  DATE OF ADMISSION:  03/08/2017  PRIMARY CARE PHYSICIAN: [facility]   REQUESTING/REFERRING PHYSICIAN: Roxan Hockey, MD  CHIEF COMPLAINT:   Chief Complaint  Patient presents with  . Abdominal Pain    HISTORY OF PRESENT ILLNESS:  Patrick [name]  is a 61 y.o. male who presents with abdominal pain and distention.  Patient has known history of cirrhosis recently diagnosed, and had paracentesis performed in the ED with improvement in his symptoms.  GI was called by ED physician who felt that patient should be observed tonight so that they can see him in the morning given how quickly his ascites recurred and also due to his elevated bilirubin.  Hospitalist were called for admission  PAST MEDICAL HISTORY:   Past Medical History:  Diagnosis Date  . Aortic atherosclerosis (HCC) 12/2016  . Arthritis   . BPH (benign prostatic hyperplasia)   . Cirrhosis of liver (HCC)   . Depression   . HTN (hypertension)   . Metatarsal fracture    2nd toe of left foot  . Tuberculosis   . Urinary frequency     PAST SURGICAL HISTORY:   Past Surgical History:  Procedure Laterality Date  . NO PAST SURGERIES    . None      SOCIAL HISTORY:   Social History   Tobacco Use  . Smoking status: Current Every Day Smoker    Packs/day: 0.50  . Smokeless tobacco: Never Used  Substance Use Topics  . Alcohol use: Yes    Alcohol/week: 0.0 oz    Comment: daily use up until October 2018    FAMILY HISTORY:   Family History  Problem Relation Age of Onset  . Hematuria Unknown   . Tuberculosis Unknown   . Diabetes Mother   . Hypertension Mother   . Kidney disease Neg Hx   . Prostate cancer Neg Hx     DRUG ALLERGIES:  No Known Allergies  MEDICATIONS AT HOME:   Prior to Admission medications   Medication Sig Start Date End Date Taking?  Authorizing Provider  finasteride (PROSCAR) 5 MG tablet Take 1 tablet (5 mg total) daily by mouth. 02/10/17   Vanna Scotland, MD  pantoprazole (PROTONIX) 40 MG tablet Take 40 mg by mouth daily.  01/16/17   [provider]  tamsulosin (FLOMAX) 0.4 MG CAPS capsule Take 1 capsule (0.4 mg total) daily after breakfast by mouth. 02/10/17   Vanna Scotland, MD    REVIEW OF SYSTEMS:  Review of Systems  Constitutional: Negative for chills, fever, malaise/fatigue and weight loss.  HENT: Negative for ear pain, hearing loss and tinnitus.   Eyes: Negative for blurred vision, double vision, pain and redness.  Respiratory: Negative for cough, hemoptysis and shortness of breath.   Cardiovascular: Negative for chest pain, palpitations, orthopnea and leg swelling.  Gastrointestinal: Positive for abdominal pain. Negative for constipation, diarrhea, nausea and vomiting.  Genitourinary: Negative for dysuria, frequency and hematuria.  Musculoskeletal: Negative for back pain, joint pain and neck pain.  Skin:       No acne, rash, or lesions  Neurological: Negative for dizziness, tremors, focal weakness and weakness.  Endo/Heme/Allergies: Negative for polydipsia. Does not bruise/bleed easily.  Psychiatric/Behavioral: Negative for depression. The patient is not nervous/anxious and does not have insomnia.      VITAL SIGNS:   Vitals:   03/08/17 2000  03/08/17 2030 03/08/17 2100 03/08/17 2130  BP: 129/87 129/85 139/72 135/82  Pulse: 90 87 86 90  Resp:      Temp:      TempSrc:      SpO2: 100% 100% 100% 100%  Weight:      Height:       Wt Readings from Last 3 Encounters:  03/08/17 74.4 kg (164 lb)  03/08/17 74.4 kg (164 lb)  02/24/17 74.4 kg (164 lb)    PHYSICAL EXAMINATION:  Physical Exam  Vitals reviewed. Constitutional: He is oriented to person, place, and time. He appears well-developed and well-nourished. No distress.  HENT:  Head: Normocephalic and atraumatic.  Mouth/Throat:  Oropharynx is clear and moist.  Eyes: Conjunctivae and EOM are normal. Pupils are equal, round, and reactive to light. No scleral icterus.  Neck: Normal range of motion. Neck supple. No JVD present. No thyromegaly present.  Cardiovascular: Normal rate, regular rhythm and intact distal pulses. Exam reveals no gallop and no friction rub.  No murmur heard. Respiratory: Effort normal and breath sounds normal. No respiratory distress. He has no wheezes. He has no rales.  GI: Soft. Bowel sounds are normal. He exhibits distension. There is tenderness.  Musculoskeletal: Normal range of motion. He exhibits no edema.  No arthritis, no gout  Lymphadenopathy:    He has no cervical adenopathy.  Neurological: He is alert and oriented to person, place, and time. No cranial nerve deficit.  No dysarthria, no aphasia  Skin: Skin is warm and dry. No rash noted. No erythema.  Psychiatric: He has a normal mood and affect. His behavior is normal. Judgment and thought content normal.    LABORATORY PANEL:   CBC Recent Labs  Lab 03/08/17 1557  WBC 7.0  HGB 13.1  HCT 37.8*  PLT 209   ------------------------------------------------------------------------------------------------------------------  Chemistries  Recent Labs  Lab 03/08/17 1557  NA 131*  K 3.7  CL 99*  CO2 25  GLUCOSE 139*  BUN 11  CREATININE 0.55*  CALCIUM 8.8*  AST 93*  ALT 50  ALKPHOS 913*  BILITOT 6.3*   ------------------------------------------------------------------------------------------------------------------  Cardiac Enzymes No results for input(s): TROPONINI in the last 168 hours. ------------------------------------------------------------------------------------------------------------------  RADIOLOGY:  [place] Abdomen Limited Ruq  Result Date: 03/08/2017 CLINICAL DATA:  Elevated bilirubin. EXAM: ULTRASOUND ABDOMEN LIMITED RIGHT UPPER QUADRANT COMPARISON:  CT abdomen 01/16/2017. FINDINGS: Gallbladder:  Thick-walled gallbladder, up to 4.6 mm, without visible stones or sludge. Negative sonographic Murphy's sign. No pericholecystic fluid. Common bile duct: Diameter: Upper limits normal, 5.5 mm. Liver: Nodular contour of the liver, with increased echotexture, suggesting cirrhosis. No focal lesions. Portal vein is patent on color Doppler imaging with normal direction of blood flow towards the liver. Incidental note is made of moderate ascites. IMPRESSION: Findings consistent with cirrhosis of the liver, with probable secondary ascites. No gallstones, but thick-walled gallbladder, almost 5 mm. No biliary ductal dilatation. Electronically Signed   By: Elsie StainJohn T Curnes M.D.   On: 03/08/2017 18:48    EKG:   Orders placed or performed during the hospital encounter of 03/08/17  . EKG 12 lead  . EKG 12 lead    IMPRESSION AND PLAN:  Principal Problem:   Hyperbilirubinemia -GI consult, ultrasound ordered per their request Active Problems:   Alcoholic cirrhosis of liver with ascites (HCC) -paracentesis performed in the ED both for diagnosis of possible SBP, but also for therapeutic result.  Patient feels much better after this procedure   BPH with obstruction/lower urinary tract symptoms -continue home meds  All the records are reviewed and case discussed with ED provider. Management plans discussed with the patient and/or family.  DVT PROPHYLAXIS: SubQ lovenox  GI PROPHYLAXIS: None  ADMISSION STATUS: Inpatient  CODE STATUS: Full Code Status History    This patient does not have a recorded code status. Please follow your organizational policy for patients in this situation.      TOTAL TIME TAKING CARE OF THIS PATIENT: 45 minutes.   Zeidy Tayag FIELDING 03/08/2017, 10:19 PM  [facility]  Office  [phone]  CC: Primary care physician; [facility]  Note:  This document was prepared using [employer] voice recognition software and may include unintentional  dictation errors.

## 2017-03-09 ENCOUNTER — Inpatient Hospital Stay: Payer: Medicaid Other

## 2017-03-09 DIAGNOSIS — K7031 Alcoholic cirrhosis of liver with ascites: Principal | ICD-10-CM

## 2017-03-09 DIAGNOSIS — R945 Abnormal results of liver function studies: Secondary | ICD-10-CM

## 2017-03-09 LAB — IRON AND TIBC
IRON: 58 ug/dL (ref 45–182)
SATURATION RATIOS: 32 % (ref 17.9–39.5)
TIBC: 181 ug/dL — AB (ref 250–450)
UIBC: 123 ug/dL

## 2017-03-09 LAB — COMPREHENSIVE METABOLIC PANEL
ALBUMIN: 2.5 g/dL — AB (ref 3.5–5.0)
ALT: 44 U/L (ref 17–63)
AST: 88 U/L — AB (ref 15–41)
Alkaline Phosphatase: 923 U/L — ABNORMAL HIGH (ref 38–126)
Anion gap: 7 (ref 5–15)
BUN: 10 mg/dL (ref 6–20)
CHLORIDE: 102 mmol/L (ref 101–111)
CO2: 22 mmol/L (ref 22–32)
CREATININE: 0.68 mg/dL (ref 0.61–1.24)
Calcium: 8.5 mg/dL — ABNORMAL LOW (ref 8.9–10.3)
GFR calc Af Amer: >60 mL/min (ref >60)
GFR calc non Af Amer: >60 mL/min (ref >60)
GLUCOSE: 128 mg/dL — AB (ref 65–99)
Potassium: 3.7 mmol/L (ref 3.5–5.1)
SODIUM: 131 mmol/L — AB (ref 135–145)
Total Bilirubin: 5.9 mg/dL — ABNORMAL HIGH (ref 0.3–1.2)
Total Protein: 6.6 g/dL (ref 6.5–8.1)

## 2017-03-09 LAB — PATHOLOGIST SMEAR REVIEW

## 2017-03-09 LAB — CBC
HCT: 34.8 % — ABNORMAL LOW (ref 40.0–52.0)
Hemoglobin: 12.3 g/dL — ABNORMAL LOW (ref 13.0–18.0)
MCH: 34.8 pg — AB (ref 26.0–34.0)
MCHC: 35.2 g/dL (ref 32.0–36.0)
MCV: 98.9 fL (ref 80.0–100.0)
PLATELETS: 186 10*3/uL (ref 150–440)
RBC: 3.52 MIL/uL — AB (ref 4.40–5.90)
RDW: 15.5 % — AB (ref 11.5–14.5)
WBC: 6.7 10*3/uL (ref 3.8–10.6)

## 2017-03-09 LAB — BILIRUBIN, FRACTIONATED(TOT/DIR/INDIR)
Bilirubin, Direct: 3.3 mg/dL — ABNORMAL HIGH (ref 0.1–0.5)
Indirect Bilirubin: 2.6 mg/dL — ABNORMAL HIGH (ref 0.3–0.9)
Total Bilirubin: 5.9 mg/dL — ABNORMAL HIGH (ref 0.3–1.2)

## 2017-03-09 LAB — FERRITIN: Ferritin: 951 ng/mL — ABNORMAL HIGH (ref 24–336)

## 2017-03-09 LAB — GAMMA GT: GGT: 333 U/L — AB (ref 7–50)

## 2017-03-09 MED ORDER — GADOBENATE DIMEGLUMINE 529 MG/ML IV SOLN
15.0000 mL | Freq: Once | INTRAVENOUS | Status: AC | PRN
  Administered 2017-03-09: 15 mL via INTRAVENOUS

## 2017-03-09 MED ORDER — TRAMADOL HCL 50 MG PO TABS
50.0000 mg | ORAL_TABLET | Freq: Four times a day (QID) | ORAL | Status: DC | PRN
  Administered 2017-03-09: 50 mg via ORAL
  Filled 2017-03-09: qty 1

## 2017-03-09 MED ORDER — SPIRONOLACTONE 50 MG PO TABS: 50.0000 mg | ORAL_TABLET | Freq: Every day | ORAL | 1 refills | Status: AC

## 2017-03-09 MED ORDER — FUROSEMIDE 20 MG PO TABS: 40.0000 mg | ORAL_TABLET | Freq: Every day | ORAL | 1 refills | Status: DC

## 2017-03-09 MED ORDER — FUROSEMIDE 20 MG PO TABS
20.0000 mg | ORAL_TABLET | Freq: Every day | ORAL | Status: DC
  Administered 2017-03-09: 20 mg via ORAL
  Filled 2017-03-09: qty 1

## 2017-03-09 MED ORDER — SPIRONOLACTONE 25 MG PO TABS
50.0000 mg | ORAL_TABLET | Freq: Every day | ORAL | Status: DC
  Administered 2017-03-09: 50 mg via ORAL
  Filled 2017-03-09: qty 2

## 2017-03-09 NOTE — Discharge Summary (Signed)
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 Aortic atherosclerosis (HCC) 12/2016  . Arthritis   . BPH (benign prostatic hyperplasia)   . Cirrhosis of liver (HCC)   . Depression   . HTN (hypertension)   . Metatarsal fracture    2nd toe of left foot  . Tuberculosis   . Urinary frequency     HOSPITAL COURSE:   Patrick [name]  is a 61 y.o. male who presents with abdominal pain and distention.  Patient has known history of cirrhosis recently diagnosed, and had paracentesis performed in the ED with improvement in his symptoms  1.Hyperbilirubinemia -GI consult - ultrasound showed consistent with cirrhosis of the liver, with probable secondary ascites. No gallstones, but thick-walled gallbladder, almost 5 mm. No biliary ductal dilatation. -Bunch of GI liver disease workup labs have been sent by Dr. Tobi Bastos -  Patient will follow results with [facility] as outpatient -MRCP negative for any biliary obstruction.  2.  Alcoholic cirrhosis of liver with ascites (HCC)  -paracentesis performed in the ED both for diagnosis of possible SBP, but also for therapeutic result. Patient feels much better after this procedure  -Patient had about 3.5 L removed.   -Fluid is negative for SBP.  Patient is now on Lasix and spironolactone. -Also a lot better after fluid removal. -Advised alcohol abstinence -Was discussed with patient's brother in the room  3.  BPH with obstruction/lower urinary tract  symptoms -continue home meds  Overall doing better.  Will discharge to home.  Patient agreeable. CONSULTS OBTAINED:  Treatment Team:  Wyline Mood, MD  DRUG ALLERGIES:  No Known Allergies  DISCHARGE MEDICATIONS:   Allergies as of 03/09/2017   No Known Allergies     Medication List    TAKE these medications   finasteride 5 MG tablet Commonly known as:  PROSCAR Take 1 tablet (5 mg total) daily by mouth.   furosemide 20 MG tablet Commonly known as:  LASIX Take 2 tablets (40 mg total) by mouth daily. Start taking on:  03/10/2017   pantoprazole 40 MG tablet Commonly known as:  PROTONIX Take 40 mg by mouth daily.   spironolactone 50 MG tablet Commonly known as:  ALDACTONE Take 1 tablet (50 mg total) by mouth daily. Start taking on:  03/10/2017   tamsulosin 0.4 MG Caps capsule Commonly known as:  FLOMAX Take 1 capsule (0.4 mg total) daily after breakfast by mouth.       If you experience worsening of your admission symptoms, develop shortness of breath, life threatening emergency, suicidal or homicidal thoughts you must seek medical attention immediately by calling 911 or calling your MD immediately  if symptoms less severe.  You Must read complete instructions/literature along with all the possible adverse reactions/side effects for all the Medicines you take and that have been prescribed to you. Take any new Medicines after you have completely understood and accept all the possible adverse reactions/side effects.   Please note  You were cared for by a hospitalist during  your hospital stay. If you have any questions about your discharge medications or the care you received while you were in the hospital after you are discharged, you can call the unit and asked to speak with the hospitalist on call if the hospitalist that took care of you is not available. Once you are discharged, your primary care physician will handle any further medical issues. Please note that NO REFILLS  for any discharge medications will be authorized once you are discharged, as it is imperative that you return to your primary care physician (or establish a relationship with a primary care physician if you do not have one) for your aftercare needs so that they can reassess your need for medications and monitor your lab values. Today   SUBJECTIVE   Doing well VITAL SIGNS:  Blood pressure (!) 117/59, pulse 99, temperature 99 F (37.2 C), temperature source Oral, resp. rate 20, height 5\' 10"  (1.778 m), weight 80 kg (176 lb 6.4 oz), SpO2 95 %.  I/O:    Intake/Output Summary (Last 24 hours) at 03/09/2017 1332 Last data filed at 03/09/2017 1228 Gross per 24 hour  Intake 50 ml  Output 4300 ml  Net -4250 ml    PHYSICAL EXAMINATION:  GENERAL:  61 y.o.-year-old patient lying in the bed with no acute distress.  EYES: Pupils equal, round, reactive to light and accommodation. No scleral icterus. Extraocular muscles intact.  HEENT: Head atraumatic, normocephalic. Oropharynx and nasopharynx clear.  NECK:  Supple, no jugular venous distention. No thyroid enlargement, no tenderness.  LUNGS: Normal breath sounds bilaterally, no wheezing, rales,rhonchi or crepitation. No use of accessory muscles of respiration.  CARDIOVASCULAR: S1, S2 normal. No murmurs, rubs, or gallops.  ABDOMEN: Soft, non-tender, distended. Bowel sounds present. No organomegaly or mass.  EXTREMITIES: No pedal edema, cyanosis, or clubbing.  NEUROLOGIC: Cranial nerves II through XII are intact. Muscle strength 5/5 in all extremities. Sensation intact. Gait not checked.  PSYCHIATRIC: The patient is alert and oriented x 3.  SKIN: No obvious rash, lesion, or ulcer.   DATA REVIEW:   CBC  Recent Labs  Lab 03/09/17 0348  WBC 6.7  HGB 12.3*  HCT 34.8*  PLT 186    Chemistries  Recent Labs  Lab 03/09/17 0348 03/09/17 0933  NA 131*  --   K 3.7  --   CL 102  --   CO2 22  --   GLUCOSE 128*  --   BUN 10  --   CREATININE  0.68  --   CALCIUM 8.5*  --   AST 88*  --   ALT 44  --   ALKPHOS 923*  --   BILITOT 5.9* 5.9*    Microbiology Results   Recent Results (from the past 240 hour(s))  Body fluid culture     Status: None (Preliminary result)   Collection Time: 03/08/17  7:40 PM  Result Value Ref Range Status   Specimen Description PERITONEAL  Final   Special Requests NONE  Final   Gram Stain   Final    RARE WBC PRESENT, PREDOMINANTLY MONONUCLEAR NO ORGANISMS SEEN Performed at [facility], [address] [place], [place] [address]    Culture PENDING  Incomplete   Report Status PENDING  Incomplete    RADIOLOGY:  Mr Liver W Wo Contrast  Result Date: 03/09/2017 CLINICAL DATA:  Abnormal liver function tests. Recently diagnosed with cirrhosis. Recent paracentesis. EXAM: MRI ABDOMEN WITHOUT AND WITH CONTRAST TECHNIQUE: Multiplanar multisequence MR imaging of the abdomen  was performed both before and after the administration of intravenous contrast. CONTRAST:  15 cc MultiHance COMPARISON:  03/08/2017 abdominal ultrasound.  01/16/2017 CT FINDINGS: Mild to moderate motion degradation throughout. Lower chest: Normal heart size without pericardial or pleural effusion. Hepatobiliary: Hepatomegaly, 20.0 cm craniocaudal. No significant steatosis. No definite evidence of cirrhosis. Normal gallbladder, without biliary ductal dilatation. Pancreas:  Normal, without mass or ductal dilatation. Spleen:  Normal in size, without focal abnormality. Adrenals/Urinary Tract: Normal adrenal glands. Normal kidneys, without hydronephrosis. Stomach/Bowel: Normal stomach and abdominal bowel loops. Vascular/Lymphatic: Aortic and branch vessel atherosclerosis. Patent portal and splenic veins. Other: Small volume ascites, relatively similar to 01/16/2017. Anasarca. Musculoskeletal: No acute osseous abnormality. IMPRESSION: 1. Motion degradation. 2. Hepatomegaly. No definite explanation for elevated liver function tests. 3. Small volume  ascites, similar. Electronically Signed   By: Jeronimo GreavesKyle  Talbot M.D.   On: 03/09/2017 12:10   [place] Abdomen Limited Ruq  Result Date: 03/08/2017 CLINICAL DATA:  Elevated bilirubin. EXAM: ULTRASOUND ABDOMEN LIMITED RIGHT UPPER QUADRANT COMPARISON:  CT abdomen 01/16/2017. FINDINGS: Gallbladder: Thick-walled gallbladder, up to 4.6 mm, without visible stones or sludge. Negative sonographic Murphy's sign. No pericholecystic fluid. Common bile duct: Diameter: Upper limits normal, 5.5 mm. Liver: Nodular contour of the liver, with increased echotexture, suggesting cirrhosis. No focal lesions. Portal vein is patent on color Doppler imaging with normal direction of blood flow towards the liver. Incidental note is made of moderate ascites. IMPRESSION: Findings consistent with cirrhosis of the liver, with probable secondary ascites. No gallstones, but thick-walled gallbladder, almost 5 mm. No biliary ductal dilatation. Electronically Signed   By: Elsie StainJohn T Curnes M.D.   On: 03/08/2017 18:48     Management plans discussed with the patient, family and they are in agreement.  CODE STATUS:     Code Status Orders  (From admission, onward)        Start     Ordered   03/08/17 2325  Full code  Continuous     03/08/17 2324    Code Status History    Date Active Date Inactive Code Status Order ID Comments User Context   This patient has a current code status but no historical code status.      TOTAL TIME TAKING CARE OF THIS PATIENT: *40* minutes.    Enedina FinnerSona Marquies Wanat M.D on 03/09/2017 at 1:32 PM  Between 7am to 6pm - Pager - [phone] After 6pm go to [email or web address] - Social research officer, governmentpassword [email or web address]  [facility]  Office  [phone]  CC: Primary care physician; [facility]

## 2017-03-09 NOTE — Progress Notes (Signed)
Patrick [name]  A and O x 4. VSS. Pt tolerating diet well. No complaints of pain or nausea. IV removed intact, prescriptions given. Pt voiced understanding of discharge instructions with no further questions. Pt discharged via wheelchair with RN.  Orvil FeilAbbie Naelle Diegel MSN, RN-BC  Allergies as of 03/09/2017   No Known Allergies     Medication List    TAKE these medications   finasteride 5 MG tablet Commonly known as:  PROSCAR Take 1 tablet (5 mg total) daily by mouth.   furosemide 20 MG tablet Commonly known as:  LASIX Take 2 tablets (40 mg total) by mouth daily. Start taking on:  03/10/2017   pantoprazole 40 MG tablet Commonly known as:  PROTONIX Take 40 mg by mouth daily.   spironolactone 50 MG tablet Commonly known as:  ALDACTONE Take 1 tablet (50 mg total) by mouth daily. Start taking on:  03/10/2017   tamsulosin 0.4 MG Caps capsule Commonly known as:  FLOMAX Take 1 capsule (0.4 mg total) daily after breakfast by mouth.       Vitals:   03/09/17 1226 03/09/17 1557  BP: (!) 117/59   Pulse: 99 (!) 109  Resp:    Temp:    SpO2:  95%

## 2017-03-09 NOTE — Progress Notes (Signed)
Per Dr. Enedina FinnerSona Patel okay to place order for 2 gram sodium diet following return from MRI

## 2017-03-09 NOTE — Progress Notes (Signed)
Per Dr. Allena KatzPatel okay to place order for Tramadol 50mg  q 6hours PRN

## 2017-03-09 NOTE — Consult Note (Signed)
Wyline Mood , MD [address], [address], [place], [place], [address] [address], [address], [place], [place], [address] Phone: [phone]  Fax: [phone]  Consultation  Referring Provider: Dr Anne Hahn Primary Care Physician:  [facility] Primary Gastroenterologist:  [facility]          Reason for Consultation:     Ascites  Date of Admission:  03/08/2017 Date of Consultation:  03/09/2017         HPI:   Patrick [name] is a 61 y.o. male who has been a patient of [facility].  Last seen them on 01/16/2017.  At the time that he was seen in the clinic he had some epigastric pain and bloating but was also on ibuprofen.  He had been consuming large quantities of alcohol.  He was noted to have elevated liver function tests obstructive pattern.  At that point of time he had an episode of hematemesis and had been scheduled for an EGD.  CT scan of the abdomen was obtained on 01/16/2017 and features suggestive of cirrhosis were noted.  Small volume of ascites were noted.  Markedly thickened urinary bladder with 2 diverticuli and focus of mural nodularity were also noted.  It appears that the clinic did try to get in touch with him but were unsuccessful.  He was subsequently seen by urology on 02/10/2017.  There is concern for underlying mass in the urinary bladder.   On this admission he was admitted on 03/08/2017 with abdominal pain and distention.  On admission his hemoglobin is 13.1 g with a platelet count of 209.  Sodium of 131 creatinine of 0.55 AST of 93 ALT of 50 total bilirubin of 6.3 and alkaline phosphatase of 913.  Serum albumin of 2.5 g.  An ultrasound of the abdomen performed yesterday showed features of cirrhosis with probable secondary ascites the gallbladder wall almost 5 mm no biliary ductal dilation seen.  He underwent a paracentesis yesterday with 573 white cell count seen in the acetic fluid but only a 15% neutrophil count which does not meet criteria for  SBP.  INR is 1.14.  Total protein in the fluid was less than 3.  I do not see albumin the ascites fluid being checked.  Culture so far is negative.He says he has drank a lot of alcohol for many years, cut down significantly recently, he was not quantifying the amount clearly despite my persistence. He has experimented with cocaine many years back. Denies any abdominal pain presently  Past Medical History:  Diagnosis Date  . Aortic atherosclerosis (HCC) 12/2016  . Arthritis   . BPH (benign prostatic hyperplasia)   . Cirrhosis of liver (HCC)   . Depression   . HTN (hypertension)   . Metatarsal fracture    2nd toe of left foot  . Tuberculosis   . Urinary frequency     Past Surgical History:  Procedure Laterality Date  . NO PAST SURGERIES    . None      Prior to Admission medications   Medication Sig Start Date End Date Taking? Authorizing Provider  finasteride (PROSCAR) 5 MG tablet Take 1 tablet (5 mg total) daily by mouth. 02/10/17  Yes Vanna [place], MD  pantoprazole (PROTONIX) 40 MG tablet Take 40 mg by mouth daily.  01/16/17   [provider]  tamsulosin (FLOMAX) 0.4 MG CAPS capsule Take 1 capsule (0.4 mg total) daily after breakfast by mouth. 02/10/17   Vanna [place], MD    Family History  Problem Relation Age of Onset  . Hematuria Unknown   . Tuberculosis Unknown   . Diabetes Mother   . Hypertension Mother   . Kidney disease Neg Hx   . Prostate cancer Neg Hx      Social History   Tobacco Use  . Smoking status: Current Every Day Smoker    Packs/day: 0.50  . Smokeless tobacco: Never Used  Substance Use Topics  . Alcohol use: Yes    Alcohol/week: 0.0 oz    Comment: daily use up until October 2018  . Drug use: No    Allergies as of 03/08/2017  . (No Known Allergies)    Review of Systems:    All systems reviewed and negative except where noted in HPI.   Physical Exam:  Vital signs in last 24 hours: Temp:  [98 F (36.7 C)-99 F (37.2 C)] 99 F  (37.2 C) (12/13 0550) Pulse Rate:  [83-102] 102 (12/13 0550) Resp:  [18-20] 20 (12/13 0550) BP: (96-143)/(60-89) 96/60 (12/13 0550) SpO2:  [95 %-100 %] 95 % (12/13 0550) Weight:  [164 lb (74.4 kg)-176 lb 6.4 oz (80 kg)] 176 lb 6.4 oz (80 kg) (12/12 2319) Last BM Date: 03/08/17 General:   Pleasant, cooperative in NAD Head:  Normocephalic and atraumatic. Eyes:   No icterus.   Conjunctiva pink. PERRLA. Ears:  Normal auditory acuity. Neck:  Supple; no masses or thyroidomegaly Lungs: Respirations even and unlabored. Lungs clear to auscultation bilaterally.   No wheezes, crackles, or rhonchi.  Heart:  Regular rate and rhythm;  Without murmur, clicks, rubs or gallops Abdomen:  Distended , non tender, tense ascitic fluid with palpable thrill. .  No rebound or guarding.  Neurologic:  Alert and oriented x3;  grossly normal neurologically. Skin:  Intact without significant lesions or rashes. Cervical Nodes:  No significant cervical adenopathy. Psych:  Alert and cooperative. Normal affect.  LAB RESULTS: Recent Labs    03/08/17 1412 03/08/17 1557 03/09/17 0348  WBC 8.0 7.0 6.7  HGB 13.1 13.1 12.3*  HCT 38.0* 37.8* 34.8*  PLT 207 209 186   BMET Recent Labs    03/08/17 1557 03/09/17 0348  NA 131* 131*  K 3.7 3.7  CL 99* 102  CO2 25 22  GLUCOSE 139* 128*  BUN 11 10  CREATININE 0.55* 0.68  CALCIUM 8.8* 8.5*   LFT Recent Labs    03/09/17 0348  PROT 6.6  ALBUMIN 2.5*  AST 88*  ALT 44  ALKPHOS 923*  BILITOT 5.9*   PT/INR Recent Labs    03/08/17 1412 03/08/17 1846  LABPROT 14.5 14.5  INR 1.14 1.14    STUDIES: [place] Abdomen Limited Ruq  Result Date: 03/08/2017 CLINICAL DATA:  Elevated bilirubin. EXAM: ULTRASOUND ABDOMEN LIMITED RIGHT UPPER QUADRANT COMPARISON:  CT abdomen 01/16/2017. FINDINGS: Gallbladder: Thick-walled gallbladder, up to 4.6 mm, without visible stones or sludge. Negative sonographic Murphy's sign. No pericholecystic fluid. Common bile duct: Diameter:  Upper limits normal, 5.5 mm. Liver: Nodular contour of the liver, with increased echotexture, suggesting cirrhosis. No focal lesions. Portal vein is patent on color Doppler imaging with normal direction of blood flow towards the liver. Incidental note is made of moderate ascites. IMPRESSION: Findings consistent with cirrhosis of the liver, with probable secondary ascites. No gallstones, but thick-walled gallbladder, almost 5 mm. No biliary ductal dilatation. Electronically Signed   By: Elsie StainJohn T Curnes M.D.   On: 03/08/2017 18:48      Impression / Plan:   Patrick [name] is a 61 y.o. y/o  male with decompensated alcoholic liver cirrhosis with ascites.  No evidence of SBP based on fluid analysis.  Incomplete analysis of ascites fluid due to absence of abdomen from the ascites fluid.  Possibly due to portal hypertension.  He does have features of cholestasis function tests.  Plan  1.  No large bile duct obstruction noted but small bile duct obstruction could account for his abnormal liver tests and hence would suggest an MRCP.IF portal vein thrombosis can be ruled out with MRCP then no doppler will be needed .   2.  I will also order tests for viral hepatitis as well as autoimmune hepatitis which he would have to follow-up as an outpatient with cardiology clinic.  His liver function test pattern is not typical of alcoholic hepatitis.  Strongly suggest to stop all alcohol, liver tests and not improving a liver biopsy may be warranted as an outpatient .  We will also check his fractionated bilirubin.  He will need an elective upper end[facility] as [facility] to screen for esophageal varices.  3.  Follow-up with urology for bladder mass thank you for involving me in the care of this patient.    4.  Suggest dietary consult with a low-salt diet less than 2 g of sodium per day.  Daily body weights.  Avoid normal saline IV infusion.  Can commence on Aldactone 50 mg and 20 mg of Lasix, repeat BMP in 3-4  days time and if tolerating his diuretics can increase the dose of Aldactone 200 mg and Lasix of 40 mg.    LOS: 1 day   Wyline MoodKiran James Lafalce, MD  03/09/2017, 8:33 AM

## 2017-03-10 ENCOUNTER — Ambulatory Visit: Payer: Self-pay | Admitting: Urology

## 2017-03-10 LAB — ENA+DNA/DS+ANTICH+CENTRO+JO...
Anti JO-1: <0.2 AI (ref 0.0–0.9)
Centromere Ab Screen: <0.2 AI (ref 0.0–0.9)
Chromatin Ab SerPl-aCnc: <0.2 AI (ref 0.0–0.9)
RIBONUCLEIC PROTEIN: 1.3 AI — AB (ref 0.0–0.9)
ds DNA Ab: <1 IU/mL (ref 0–9)

## 2017-03-10 LAB — MITOCHONDRIAL ANTIBODIES: MITOCHONDRIAL M2 AB, IGG: 5 U (ref 0.0–20.0)

## 2017-03-10 LAB — URINE CULTURE

## 2017-03-10 LAB — HEPATITIS B CORE ANTIBODY, IGM: HEP B C IGM: NEGATIVE

## 2017-03-10 LAB — ANTI-MICROSOMAL ANTIBODY LIVER / KIDNEY: LKM1 Ab: 1.3 Units (ref 0.0–20.0)

## 2017-03-10 LAB — HEPATITIS B CORE ANTIBODY, TOTAL: HEP B C TOTAL AB: NEGATIVE

## 2017-03-10 LAB — ANA W/REFLEX IF POSITIVE: Anti Nuclear Antibody(ANA): POSITIVE — AB

## 2017-03-10 LAB — PTH, INTACT AND CALCIUM
CALCIUM TOTAL (PTH): 8.6 mg/dL (ref 8.6–10.2)
PTH: 29 pg/mL (ref 15–65)

## 2017-03-10 LAB — HEPATITIS C ANTIBODY: HCV AB: 0.1 {s_co_ratio} (ref 0.0–0.9)

## 2017-03-10 LAB — HEPATITIS B E ANTIGEN: HEP B E AG: NEGATIVE

## 2017-03-10 LAB — HEPATITIS A ANTIBODY, TOTAL: HEP A TOTAL AB: NEGATIVE

## 2017-03-10 LAB — PROTEIN, BODY FLUID (OTHER): Total Protein, Body Fluid Other: 1.4 g/dL

## 2017-03-10 LAB — ANTI-SMOOTH MUSCLE ANTIBODY, IGG: F-ACTIN AB IGG: 15 U (ref 0–19)

## 2017-03-10 LAB — HEPATITIS A ANTIBODY, IGM: Hep A IgM: NEGATIVE

## 2017-03-10 LAB — CERULOPLASMIN: CERULOPLASMIN: 46.4 mg/dL — AB (ref 16.0–31.0)

## 2017-03-10 LAB — HEPATITIS B SURFACE ANTIGEN: HEP B S AG: NEGATIVE

## 2017-03-10 LAB — HIV ANTIBODY (ROUTINE TESTING W REFLEX): HIV SCREEN 4TH GENERATION: NONREACTIVE

## 2017-03-10 NOTE — Pre-Procedure Instructions (Signed)
Met B and CBC results sent to Dr. Erlene Quan and Anesthesia for review.  UA also sent to Dr. Erlene Quan for review.

## 2017-03-11 LAB — IMMUNOGLOBULINS A/E/G/M, SERUM
IgA: 315 mg/dL (ref 61–437)
IgE (Immunoglobulin E), Serum: 774 IU/mL — ABNORMAL HIGH (ref 0–100)
IgG (Immunoglobin G), Serum: 1770 mg/dL — ABNORMAL HIGH (ref 700–1600)
IgM (Immunoglobulin M), Srm: 282 mg/dL — ABNORMAL HIGH (ref 20–172)

## 2017-03-11 LAB — HEPATITIS B DNA, ULTRAQUANTITATIVE, PCR
HBV DNA SERPL PCR-ACNC: NOT DETECTED IU/mL
HBV DNA SERPL PCR-LOG IU: UNDETERMINED log10 IU/mL

## 2017-03-12 LAB — BODY FLUID CULTURE: Culture: NO GROWTH

## 2017-03-12 LAB — HEPATITIS C VRS RNA DETECT BY PCR-QUAL: Hepatitis C Vrs RNA by PCR-Qual: NEGATIVE

## 2017-03-13 ENCOUNTER — Telehealth: Payer: Self-pay

## 2017-03-13 LAB — ALPHA-1 ANTITRYPSIN PHENOTYPE: A-1 Antitrypsin, Ser: 182 mg/dL (ref 90–200)

## 2017-03-13 LAB — HEPATITIS B E ANTIBODY: HEP B E AB: NEGATIVE

## 2017-03-13 NOTE — Telephone Encounter (Signed)
LVM for patient callback for results per Dr. Tobi BastosAnna.    - Please inform has elevated immunoglobulins and positive ANA- will need [facility]

## 2017-03-15 ENCOUNTER — Other Ambulatory Visit: Payer: Self-pay | Admitting: Gastroenterology

## 2017-03-15 DIAGNOSIS — K7031 Alcoholic cirrhosis of liver with ascites: Secondary | ICD-10-CM

## 2017-03-16 ENCOUNTER — Ambulatory Visit
Admission: RE | Admit: 2017-03-16 | Discharge: 2017-03-16 | Disposition: A | Discharge status: Home / Self Care | Payer: Medicaid Other | Source: Ambulatory Visit | Attending: Gastroenterology | Admitting: Gastroenterology

## 2017-03-16 DIAGNOSIS — K7030 Alcoholic cirrhosis of liver without ascites: Secondary | ICD-10-CM

## 2017-03-16 DIAGNOSIS — K7031 Alcoholic cirrhosis of liver with ascites: Secondary | ICD-10-CM

## 2017-03-16 LAB — BODY FLUID CELL COUNT WITH DIFFERENTIAL
LYMPHS FL: 11 %
Monocyte-Macrophage-Serous Fluid: 4 %
Neutrophil Count, Fluid: 85 %
Total Nucleated Cell Count, Fluid: 792 cu mm

## 2017-03-16 LAB — ALBUMIN, PLEURAL OR PERITONEAL FLUID

## 2017-03-16 LAB — PROTEIN, PLEURAL OR PERITONEAL FLUID: Total protein, fluid: <3 g/dL

## 2017-03-17 ENCOUNTER — Inpatient Hospital Stay
Admission: EM | Admit: 2017-03-17 | Discharge: 2017-03-27 | DRG: 432 | Disposition: A | Discharge status: Home Health | Payer: Medicaid Other | Attending: Internal Medicine | Admitting: Internal Medicine

## 2017-03-17 ENCOUNTER — Encounter: Payer: Self-pay | Admitting: Emergency Medicine

## 2017-03-17 DIAGNOSIS — K7290 Hepatic failure, unspecified without coma: Secondary | ICD-10-CM | POA: Diagnosis present

## 2017-03-17 DIAGNOSIS — E871 Hypo-osmolality and hyponatremia: Secondary | ICD-10-CM | POA: Diagnosis present

## 2017-03-17 DIAGNOSIS — K7011 Alcoholic hepatitis with ascites: Secondary | ICD-10-CM

## 2017-03-17 DIAGNOSIS — Z6823 Body mass index (BMI) 23.0-23.9, adult: Secondary | ICD-10-CM | POA: Diagnosis not present

## 2017-03-17 DIAGNOSIS — Z8249 Family history of ischemic heart disease and other diseases of the circulatory system: Secondary | ICD-10-CM

## 2017-03-17 DIAGNOSIS — M1990 Unspecified osteoarthritis, unspecified site: Secondary | ICD-10-CM | POA: Diagnosis present

## 2017-03-17 DIAGNOSIS — F17200 Nicotine dependence, unspecified, uncomplicated: Secondary | ICD-10-CM | POA: Diagnosis present

## 2017-03-17 DIAGNOSIS — I700 Atherosclerosis of aorta: Secondary | ICD-10-CM | POA: Diagnosis present

## 2017-03-17 DIAGNOSIS — N401 Enlarged prostate with lower urinary tract symptoms: Secondary | ICD-10-CM | POA: Diagnosis present

## 2017-03-17 DIAGNOSIS — K652 Spontaneous bacterial peritonitis: Secondary | ICD-10-CM | POA: Diagnosis present

## 2017-03-17 DIAGNOSIS — F1010 Alcohol abuse, uncomplicated: Secondary | ICD-10-CM | POA: Diagnosis present

## 2017-03-17 DIAGNOSIS — I10 Essential (primary) hypertension: Secondary | ICD-10-CM | POA: Diagnosis present

## 2017-03-17 DIAGNOSIS — R109 Unspecified abdominal pain: Secondary | ICD-10-CM

## 2017-03-17 DIAGNOSIS — R1900 Intra-abdominal and pelvic swelling, mass and lump, unspecified site: Secondary | ICD-10-CM

## 2017-03-17 DIAGNOSIS — K7031 Alcoholic cirrhosis of liver with ascites: Principal | ICD-10-CM | POA: Diagnosis present

## 2017-03-17 DIAGNOSIS — K5900 Constipation, unspecified: Secondary | ICD-10-CM | POA: Diagnosis not present

## 2017-03-17 DIAGNOSIS — R338 Other retention of urine: Secondary | ICD-10-CM | POA: Diagnosis present

## 2017-03-17 DIAGNOSIS — K529 Noninfective gastroenteritis and colitis, unspecified: Secondary | ICD-10-CM | POA: Diagnosis present

## 2017-03-17 DIAGNOSIS — E43 Unspecified severe protein-calorie malnutrition: Secondary | ICD-10-CM | POA: Diagnosis present

## 2017-03-17 DIAGNOSIS — K7460 Unspecified cirrhosis of liver: Secondary | ICD-10-CM | POA: Diagnosis present

## 2017-03-17 DIAGNOSIS — R188 Other ascites: Secondary | ICD-10-CM | POA: Diagnosis not present

## 2017-03-17 DIAGNOSIS — Z833 Family history of diabetes mellitus: Secondary | ICD-10-CM | POA: Diagnosis not present

## 2017-03-17 DIAGNOSIS — Z1339 Encounter for screening examination for other mental health and behavioral disorders: Secondary | ICD-10-CM | POA: Diagnosis not present

## 2017-03-17 LAB — CBC WITH DIFFERENTIAL/PLATELET
BASOS PCT: 0 %
Basophils Absolute: 0 10*3/uL (ref 0–0.1)
EOS ABS: 0.1 10*3/uL (ref 0–0.7)
Eosinophils Relative: 1 %
HCT: 40.6 % (ref 40.0–52.0)
HEMOGLOBIN: 13.9 g/dL (ref 13.0–18.0)
Lymphocytes Relative: 36 %
Lymphs Abs: 3.1 10*3/uL (ref 1.0–3.6)
MCH: 34.3 pg — ABNORMAL HIGH (ref 26.0–34.0)
MCHC: 34.3 g/dL (ref 32.0–36.0)
MCV: 100 fL (ref 80.0–100.0)
MONOS PCT: 15 %
Monocytes Absolute: 1.3 10*3/uL — ABNORMAL HIGH (ref 0.2–1.0)
NEUTROS PCT: 48 %
Neutro Abs: 4.2 10*3/uL (ref 1.4–6.5)
PLATELETS: 221 10*3/uL (ref 150–440)
RBC: 4.06 MIL/uL — ABNORMAL LOW (ref 4.40–5.90)
RDW: 15.5 % — ABNORMAL HIGH (ref 11.5–14.5)
WBC: 8.7 10*3/uL (ref 3.8–10.6)

## 2017-03-17 LAB — ETHANOL

## 2017-03-17 LAB — COMPREHENSIVE METABOLIC PANEL
ALK PHOS: 990 U/L — AB (ref 38–126)
ALT: 48 U/L (ref 17–63)
AST: 106 U/L — ABNORMAL HIGH (ref 15–41)
Albumin: 2.6 g/dL — ABNORMAL LOW (ref 3.5–5.0)
Anion gap: 10 (ref 5–15)
BUN: 16 mg/dL (ref 6–20)
CALCIUM: 8.7 mg/dL — AB (ref 8.9–10.3)
CO2: 22 mmol/L (ref 22–32)
CREATININE: 0.83 mg/dL (ref 0.61–1.24)
Chloride: 96 mmol/L — ABNORMAL LOW (ref 101–111)
Glucose, Bld: 155 mg/dL — ABNORMAL HIGH (ref 65–99)
Potassium: 5.1 mmol/L (ref 3.5–5.1)
Sodium: 128 mmol/L — ABNORMAL LOW (ref 135–145)
Total Bilirubin: 7.9 mg/dL — ABNORMAL HIGH (ref 0.3–1.2)
Total Protein: 7.3 g/dL (ref 6.5–8.1)

## 2017-03-17 LAB — PROTIME-INR
INR: 1.1
PROTHROMBIN TIME: 14.1 s (ref 11.4–15.2)

## 2017-03-17 LAB — CYTOLOGY - NON PAP

## 2017-03-17 LAB — PROTEIN, BODY FLUID (OTHER): Total Protein, Body Fluid Other: 0.9 g/dL

## 2017-03-17 LAB — TROPONIN I: Troponin I: 0.03 ng/mL (ref <0.03)

## 2017-03-17 LAB — AMMONIA: AMMONIA: 42 umol/L — AB (ref 9–35)

## 2017-03-17 MED ORDER — FINASTERIDE 5 MG PO TABS
5.0000 mg | ORAL_TABLET | Freq: Every day | ORAL | Status: DC
  Administered 2017-03-18 – 2017-03-27 (×10): 5 mg via ORAL
  Filled 2017-03-17 (×10): qty 1

## 2017-03-17 MED ORDER — SODIUM CHLORIDE 0.9 % IV SOLN: 250.0000 mL | INTRAVENOUS | Status: DC | PRN

## 2017-03-17 MED ORDER — HEPARIN SODIUM (PORCINE) 5000 UNIT/ML IJ SOLN
5000.0000 [IU] | Freq: Three times a day (TID) | INTRAMUSCULAR | Status: DC
  Administered 2017-03-18 – 2017-03-26 (×26): 5000 [IU] via SUBCUTANEOUS
  Filled 2017-03-17 (×26): qty 1

## 2017-03-17 MED ORDER — DEXTROSE 5 % IV SOLN: 2.0000 g | Freq: Three times a day (TID) | INTRAVENOUS | Status: DC

## 2017-03-17 MED ORDER — PANTOPRAZOLE SODIUM 40 MG PO TBEC
40.0000 mg | DELAYED_RELEASE_TABLET | Freq: Every day | ORAL | Status: DC
  Administered 2017-03-18 – 2017-03-27 (×10): 40 mg via ORAL
  Filled 2017-03-17 (×10): qty 1

## 2017-03-17 MED ORDER — SODIUM CHLORIDE 1 G PO TABS
2.0000 g | ORAL_TABLET | Freq: Three times a day (TID) | ORAL | Status: AC
  Administered 2017-03-18 (×2): 2 g via ORAL
  Filled 2017-03-17 (×3): qty 2

## 2017-03-17 MED ORDER — FUROSEMIDE 40 MG PO TABS
40.0000 mg | ORAL_TABLET | Freq: Every day | ORAL | Status: DC
  Administered 2017-03-18 – 2017-03-20 (×3): 40 mg via ORAL
  Filled 2017-03-17 (×5): qty 1

## 2017-03-17 MED ORDER — ONDANSETRON HCL 4 MG/2ML IJ SOLN
4.0000 mg | Freq: Four times a day (QID) | INTRAMUSCULAR | Status: DC | PRN
  Administered 2017-03-24: 4 mg via INTRAVENOUS
  Filled 2017-03-17: qty 2

## 2017-03-17 MED ORDER — SODIUM CHLORIDE 0.9% FLUSH
3.0000 mL | Freq: Two times a day (BID) | INTRAVENOUS | Status: DC
  Administered 2017-03-18 – 2017-03-27 (×19): 3 mL via INTRAVENOUS

## 2017-03-17 MED ORDER — ADULT MULTIVITAMIN W/MINERALS CH
1.0000 | ORAL_TABLET | Freq: Every day | ORAL | Status: DC
  Administered 2017-03-18 – 2017-03-27 (×10): 1 via ORAL
  Filled 2017-03-17 (×10): qty 1

## 2017-03-17 MED ORDER — ONDANSETRON HCL 4 MG PO TABS: 4.0000 mg | ORAL_TABLET | Freq: Four times a day (QID) | ORAL | Status: DC | PRN

## 2017-03-17 MED ORDER — ACETAMINOPHEN 650 MG RE SUPP
650.0000 mg | Freq: Four times a day (QID) | RECTAL | Status: DC | PRN
Start: 2017-03-17 — End: 2017-03-27

## 2017-03-17 MED ORDER — TAMSULOSIN HCL 0.4 MG PO CAPS
0.4000 mg | ORAL_CAPSULE | Freq: Every day | ORAL | Status: DC
  Administered 2017-03-18 – 2017-03-27 (×10): 0.4 mg via ORAL
  Filled 2017-03-17 (×10): qty 1

## 2017-03-17 MED ORDER — POLYETHYLENE GLYCOL 3350 17 G PO PACK
17.0000 g | PACK | Freq: Every day | ORAL | Status: DC | PRN
  Administered 2017-03-23: 17 g via ORAL
  Filled 2017-03-17: qty 1

## 2017-03-17 MED ORDER — MORPHINE SULFATE (PF) 2 MG/ML IV SOLN
2.0000 mg | Freq: Once | INTRAVENOUS | Status: AC
  Administered 2017-03-17: 2 mg via INTRAVENOUS
  Filled 2017-03-17: qty 1

## 2017-03-17 MED ORDER — DEXTROSE 5 % IV SOLN
2.0000 g | INTRAVENOUS | Status: DC
  Administered 2017-03-17 – 2017-03-26 (×10): 2 g via INTRAVENOUS
  Filled 2017-03-17 (×11): qty 2

## 2017-03-17 MED ORDER — SODIUM CHLORIDE 0.9% FLUSH: 3.0000 mL | Freq: Two times a day (BID) | INTRAVENOUS | Status: DC

## 2017-03-17 MED ORDER — HYDROCODONE-ACETAMINOPHEN 5-325 MG PO TABS
1.0000 | ORAL_TABLET | ORAL | Status: DC | PRN
  Administered 2017-03-18 – 2017-03-21 (×5): 2 via ORAL
  Administered 2017-03-21: 1 via ORAL
  Administered 2017-03-22 – 2017-03-26 (×6): 2 via ORAL
  Filled 2017-03-17 (×12): qty 2

## 2017-03-17 MED ORDER — ACETAMINOPHEN 325 MG PO TABS: 650.0000 mg | ORAL_TABLET | Freq: Four times a day (QID) | ORAL | Status: DC | PRN

## 2017-03-17 MED ORDER — SPIRONOLACTONE 25 MG PO TABS
50.0000 mg | ORAL_TABLET | Freq: Every day | ORAL | Status: DC
  Administered 2017-03-18 – 2017-03-27 (×9): 50 mg via ORAL
  Filled 2017-03-17 (×10): qty 2

## 2017-03-17 MED ORDER — SODIUM CHLORIDE 0.9% FLUSH: 3.0000 mL | INTRAVENOUS | Status: DC | PRN

## 2017-03-17 NOTE — ED Notes (Signed)
Patient transported to [id number]

## 2017-03-17 NOTE — ED Provider Notes (Signed)
[facility] Emergency Department Provider Note ____________________________________________   First MD Initiated Contact with Patient 03/17/17 1801     (approximate)  I have reviewed the triage vital signs and the nursing notes.   HISTORY  Chief Complaint No chief complaint on file.    HPI Patrick [name] is a 61 y.o. male with past medical history as noted below including alcoholic liver cirrhosis with ascites who presents with worsening abdominal pain, gradual onset over the last several days, associated with chills, and with nausea.  The patient had a paracentesis as an outpatient yesterday, and the results were concerning for SBP.  The patient's gastroenterologist Dr. Kathryne Hitch attempted to contact him yesterday and was not able to get a response.  She sent the sheriff's office out to his house but he refused to go to the emergency department at that time.  Today she was able to get in touch with the family member and the patient came to the emergency department.  Past Medical History:  Diagnosis Date  . Aortic atherosclerosis (HCC) 12/2016  . Arthritis   . BPH (benign prostatic hyperplasia)   . Cirrhosis of liver (HCC)   . Depression   . HTN (hypertension)   . Metatarsal fracture    2nd toe of left foot  . Tuberculosis   . Urinary frequency     Patient Active Problem List   Diagnosis Date Noted  . Alcoholic cirrhosis of liver with ascites (HCC) 03/08/2017  . Hyperbilirubinemia 03/08/2017  . HTN (hypertension) 03/08/2017  . BPH with obstruction/lower urinary tract symptoms 07/22/2015  . Urinary frequency 07/22/2015  . Phimosis 07/22/2015    Past Surgical History:  Procedure Laterality Date  . NO PAST SURGERIES    . None      Prior to Admission medications   Medication Sig Start Date End Date Taking? Authorizing Provider  finasteride (PROSCAR) 5 MG tablet Take 1 tablet (5 mg total) daily by mouth. 02/10/17   Vanna Scotland, MD  furosemide  (LASIX) 20 MG tablet Take 2 tablets (40 mg total) by mouth daily. 03/10/17   Enedina Finner, MD  pantoprazole (PROTONIX) 40 MG tablet Take 40 mg by mouth daily.  01/16/17   [provider]  spironolactone (ALDACTONE) 50 MG tablet Take 1 tablet (50 mg total) by mouth daily. 03/10/17   Enedina Finner, MD  tamsulosin (FLOMAX) 0.4 MG CAPS capsule Take 1 capsule (0.4 mg total) daily after breakfast by mouth. 02/10/17   Vanna Scotland, MD    Allergies Patient has no known allergies.  Family History  Problem Relation Age of Onset  . Hematuria Unknown   . Tuberculosis Unknown   . Diabetes Mother   . Hypertension Mother   . Kidney disease Neg Hx   . Prostate cancer Neg Hx     Social History Social History   Tobacco Use  . Smoking status: Current Every Day Smoker    Packs/day: 0.50  . Smokeless tobacco: Never Used  Substance Use Topics  . Alcohol use: Yes    Alcohol/week: 0.0 oz    Comment: daily use up until October 2018  . Drug use: No    Review of Systems  Constitutional: Positive for subjective chills. Eyes: Positive for scleral icterus.. ENT: No sore throat. Cardiovascular: Denies chest pain. Respiratory: Denies shortness of breath. Gastrointestinal: Positive for nausea, no vomiting.    Genitourinary: Negative for dysuria.  Musculoskeletal: Negative for back pain. Skin: Negative for rash. Neurological: Negative for headache.   ____________________________________________  PHYSICAL EXAM:  VITAL SIGNS: ED Triage Vitals  Enc Vitals Group     BP 03/17/17 1722 101/66     Pulse Rate 03/17/17 1722 95     Resp 03/17/17 1722 16     Temp 03/17/17 1722 97.7 F (36.5 C)     Temp src --      SpO2 03/17/17 1722 97 %     Weight 03/17/17 1722 176 lb (79.8 kg)     Height 03/17/17 1722 5\' 10"  (1.778 m)     Head Circumference --      Peak Flow --      Pain Score 03/17/17 1721 0     Pain Loc --      Pain Edu? --      Excl. in GC? --     Constitutional: Alert and  oriented x4.  Relatively comfortable appearing and in no acute distress. Eyes: Positive for scleral icterus.  EOMI.  PERRLA. Head: Atraumatic. Nose: No congestion/rhinnorhea. Mouth/Throat: Mucous membranes are somewhat dry.   Neck: Normal range of motion.  Cardiovascular: Normal rate, regular rhythm. Grossly normal heart sounds.  Good peripheral circulation. Respiratory: Normal respiratory effort.  No retractions. Lungs CTAB. Gastrointestinal: Soft with minimal diffuse tenderness, and significant distention. Genitourinary: No lank tenderness. Musculoskeletal: No lower extremity edema.  Extremities warm and well perfused.  Neurologic:  Normal speech and language. No gross focal neurologic deficits are appreciated.  Skin:  Skin is warm and dry. No rash noted. Psychiatric: Mood and affect are normal. Speech and behavior are normal.  ____________________________________________   LABS (all labs ordered are listed, but only abnormal results are displayed)  Labs Reviewed  COMPREHENSIVE METABOLIC PANEL - Abnormal; Notable for the following components:      Result Value   Sodium 128 (*)    Chloride 96 (*)    Glucose, Bld 155 (*)    Calcium 8.7 (*)    Albumin 2.6 (*)    AST 106 (*)    Alkaline Phosphatase 990 (*)    Total Bilirubin 7.9 (*)    All other components within normal limits  CBC WITH DIFFERENTIAL/PLATELET - Abnormal; Notable for the following components:   RBC 4.06 (*)    MCH 34.3 (*)    RDW 15.5 (*)    Monocytes Absolute 1.3 (*)    All other components within normal limits  AMMONIA - Abnormal; Notable for the following components:   Ammonia 42 (*)    All other components within normal limits  TROPONIN I - Abnormal; Notable for the following components:   Troponin I 0.03 (*)    All other components within normal limits  PROTIME-INR  ETHANOL  URINALYSIS, COMPLETE (UACMP) WITH MICROSCOPIC   ____________________________________________  EKG  ED ECG REPORT I,  Dionne BucySebastian Zykeem Bauserman, the attending physician, personally viewed and interpreted this ECG.  Date: 03/17/2017 EKG Time: 1740 Rate: 92 Rhythm: normal sinus rhythm QRS Axis: normal Intervals: LAFB, borderline prolonged QT ST/T Wave abnormalities: normal Narrative Interpretation: no evidence of acute ischemia; no significant change when compared to EKG of 03/08/2017  ____________________________________________  RADIOLOGY    ____________________________________________   PROCEDURES  Procedure(s) performed: No    Critical Care performed: No ____________________________________________   INITIAL IMPRESSION / ASSESSMENT AND PLAN / ED COURSE  Pertinent labs & imaging results that were available during my care of the patient were reviewed by me and considered in my medical decision making (see chart for details).  61 year old male with past medical history as noted above presents  for worsening abdominal pain and distention with chills, and concern based on paracentesis results from yesterday for SBP.  I reviewed the past medical records in epic; the patient was admitted to the hospital on 12/12 for abdominal distention and cirrhosis, with negative paracentesis, but with significant clinical improvement after therapeutic drainage.  I also discussed the case extensively over the phone with patient's gastroenterologist Dr. Kathryne HitchLondon, and reviewed the results of patient's paracentesis performed yesterday, which shows 792 WBCs with 85% neutrophils, concerning for SBP.  Cultures are negative after less than 24 hours.  On exam, the vital signs are normal, the patient is relatively comfortable appearing, and he is oriented x4 although he is altered from baseline per family member.  The remainder of the neuro exam is nonfocal, and the abdomen is distended with mild tenderness and consistent with ascites.  Presentation is most consistent with SBP, however will obtain basic labs, ammonia to evaluate  for hepatic encephalopathy, and UA to evaluate for UTI.  I will give ceftriaxone and plan for admission pending remainder of workup.     ----------------------------------------- 9:11 PM on 03/17/2017 -----------------------------------------  Remainder of the lab workup shows no significant change from patient's recent labs.  Antibiotics given.  I signed the patient out to the hospitalist Dr. Ellan LambertSallary.  ____________________________________________   FINAL CLINICAL IMPRESSION(S) / ED DIAGNOSES  Final diagnoses:  Spontaneous bacterial peritonitis (HCC)      NEW MEDICATIONS STARTED DURING THIS VISIT:  This SmartLink is deprecated. Use AVSMEDLIST instead to display the medication list for a patient.   Note:  This document was prepared using Dragon voice recognition software and may include unintentional dictation errors.    Dionne BucySiadecki, [place] Prout, MD 03/17/17 2111

## 2017-03-17 NOTE — H&P (Signed)
[facility] [place] at [facility]   PATIENT NAME: Patrick [name]    MR#:  [id number]  DATE OF BIRTH:  [birth year]  DATE OF ADMISSION:  03/17/2017  PRIMARY CARE PHYSICIAN: Center, [facility]   REQUESTING/REFERRING PHYSICIAN:   CHIEF COMPLAINT:  No chief complaint on file.   HISTORY OF PRESENT ILLNESS: Patrick LigasWeldon Breeden  is a 61 y.o. male with a known history of alcohol induced liver cirrhosis with associated ascites-last drink was 2 weeks ago, status post paracentesis on yesterday- per ED attending results suspicious for SBP in discussion with gastroenterology, attempts were made to have patient return to the hospital system but patient had refused, patient had episodes of abdominal pain as well as altered mental status, chills, nausea, in the emergency room sodium 128, chloride 96, AST 106, ammonia level 42, CBC unimpressive, INR normal, peritoneal fluid from yesterday noted for white blood cell count greater than 700, cultures thus far negative, had 4.2 L taken off, patient evaluated emergency room, resting comfortably in bed, significant other at the bedside, patient now been admitted for presumed SBP.  PAST MEDICAL HISTORY:   Past Medical History:  Diagnosis Date  . Aortic atherosclerosis (HCC) 12/2016  . Arthritis   . BPH (benign prostatic hyperplasia)   . Cirrhosis of liver (HCC)   . Depression   . HTN (hypertension)   . Metatarsal fracture    2nd toe of left foot  . Tuberculosis   . Urinary frequency     PAST SURGICAL HISTORY:  Past Surgical History:  Procedure Laterality Date  . NO PAST SURGERIES    . None      SOCIAL HISTORY:  Social History   Tobacco Use  . Smoking status: Current Every Day Smoker    Packs/day: 0.50  . Smokeless tobacco: Never Used  Substance Use Topics  . Alcohol use: Yes    Alcohol/week: 0.0 oz    Comment: daily use up until October 2018    FAMILY HISTORY:  Family History  Problem Relation Age of Onset  .  Hematuria Unknown   . Tuberculosis Unknown   . Diabetes Mother   . Hypertension Mother   . Kidney disease Neg Hx   . Prostate cancer Neg Hx     DRUG ALLERGIES: No Known Allergies  REVIEW OF SYSTEMS:   CONSTITUTIONAL: No fever, +fatigue/weakness.  EYES: No blurred or double vision.  EARS, NOSE, AND THROAT: No tinnitus or ear pain.  RESPIRATORY: No cough, shortness of breath, wheezing or hemoptysis.  CARDIOVASCULAR: No chest pain, orthopnea, edema.  GASTROINTESTINAL: + nausea, no vomiting, diarrhea, + abdominal pain/distention.  GENITOURINARY: No dysuria, hematuria.  ENDOCRINE: No polyuria, nocturia,  HEMATOLOGY: No anemia, easy bruising or bleeding SKIN: No rash or lesion. MUSCULOSKELETAL: No joint pain or arthritis.   NEUROLOGIC: No tingling, numbness, weakness.  PSYCHIATRY: No anxiety or depression.   MEDICATIONS AT HOME:  Prior to Admission medications   Medication Sig Start Date End Date Taking? Authorizing Provider  finasteride (PROSCAR) 5 MG tablet Take 1 tablet (5 mg total) daily by mouth. 02/10/17   Vanna ScotlandBrandon, Ashley, MD  furosemide (LASIX) 20 MG tablet Take 2 tablets (40 mg total) by mouth daily. 03/10/17   Enedina FinnerPatel, Sona, MD  pantoprazole (PROTONIX) 40 MG tablet Take 40 mg by mouth daily.  01/16/17   [provider]  spironolactone (ALDACTONE) 50 MG tablet Take 1 tablet (50 mg total) by mouth daily. 03/10/17   Enedina FinnerPatel, Sona, MD  tamsulosin (FLOMAX) 0.4 MG CAPS capsule  Take 1 capsule (0.4 mg total) daily after breakfast by mouth. 02/10/17   Vanna ScotlandBrandon, Ashley, MD      PHYSICAL EXAMINATION:   VITAL SIGNS: Blood pressure 105/72, pulse 83, temperature 97.7 F (36.5 C), resp. rate 12, height 5\' 10"  (1.778 m), weight 79.8 kg (176 lb), SpO2 98 %.  GENERAL:  61 y.o.-year-old patient lying in the bed with no acute distress.  Frail-appearing EYES: Pupils equal, round, reactive to light and accommodation. No scleral icterus. Extraocular muscles intact.  HEENT: Head  atraumatic, normocephalic. Oropharynx and nasopharynx clear.  NECK:  Supple, no jugular venous distention. No thyroid enlargement, no tenderness.  LUNGS: Normal breath sounds bilaterally, no wheezing, rales,rhonchi or crepitation. No use of accessory muscles of respiration.  CARDIOVASCULAR: S1, S2 normal. No murmurs, rubs, or gallops.  ABDOMEN: Markedly distended abdomen, no rebound/guarding noted, hypoactive bowel sounds. No organomegaly or mass.  EXTREMITIES: No pedal edema, cyanosis, or clubbing.  NEUROLOGIC: Cranial nerves II through XII are intact. MAES. Gait not checked.  PSYCHIATRIC: The patient is alert and oriented x 3.  SKIN: No obvious rash, lesion, or ulcer.   LABORATORY PANEL:   CBC Recent Labs  Lab 03/17/17 1736  WBC 8.7  HGB 13.9  HCT 40.6  PLT 221  MCV 100.0  MCH 34.3*  MCHC 34.3  RDW 15.5*  LYMPHSABS 3.1  MONOABS 1.3*  EOSABS 0.1  BASOSABS 0.0   ------------------------------------------------------------------------------------------------------------------  Chemistries  Recent Labs  Lab 03/17/17 1736  NA 128*  K 5.1  CL 96*  CO2 22  GLUCOSE 155*  BUN 16  CREATININE 0.83  CALCIUM 8.7*  AST 106*  ALT 48  ALKPHOS 990*  BILITOT 7.9*   ------------------------------------------------------------------------------------------------------------------ estimated creatinine clearance is 96.5 mL/min (by C-G formula based on SCr of 0.83 mg/dL). ------------------------------------------------------------------------------------------------------------------ No results for input(s): TSH, T4TOTAL, T3FREE, THYROIDAB in the last 72 hours.  Invalid input(s): FREET3   Coagulation profile Recent Labs  Lab 03/17/17 1736  INR 1.10   ------------------------------------------------------------------------------------------------------------------- No results for input(s): DDIMER in the last 72  hours. -------------------------------------------------------------------------------------------------------------------  Cardiac Enzymes Recent Labs  Lab 03/17/17 1736  TROPONINI 0.03*   ------------------------------------------------------------------------------------------------------------------ Invalid input(s): POCBNP  ---------------------------------------------------------------------------------------------------------------  Urinalysis    Component Value Date/Time   COLORURINE YELLOW (A) 03/08/2017 1412   APPEARANCEUR CLEAR (A) 03/08/2017 1412   APPEARANCEUR Clear 07/22/2015 1405   LABSPEC 1.005 03/08/2017 1412   PHURINE 5.0 03/08/2017 1412   GLUCOSEU NEGATIVE 03/08/2017 1412   HGBUR SMALL (A) 03/08/2017 1412   BILIRUBINUR NEGATIVE 03/08/2017 1412   BILIRUBINUR Negative 07/22/2015 1405   KETONESUR NEGATIVE 03/08/2017 1412   PROTEINUR NEGATIVE 03/08/2017 1412   NITRITE NEGATIVE 03/08/2017 1412   LEUKOCYTESUR MODERATE (A) 03/08/2017 1412   LEUKOCYTESUR 1+ (A) 07/22/2015 1405     RADIOLOGY: [place] Paracentesis  Result Date: 03/16/2017 INDICATION: Cirrhosis and ascites. EXAM: ULTRASOUND GUIDED PARACENTESIS MEDICATIONS: None. COMPLICATIONS: None immediate. PROCEDURE: Informed written consent was obtained from the patient after a discussion of the risks, benefits and alternatives to treatment. A timeout was performed prior to the initiation of the procedure. Initial ultrasound scanning was performed to localize ascites. The left lower abdomen was prepped and draped in the usual sterile fashion. 1% lidocaine was used for local anesthesia. Following this, a 6 Fr Safe-T-Centesis catheter was introduced. An ultrasound image was saved for documentation purposes. The paracentesis was performed. The catheter was removed and a dressing was applied. The patient tolerated the procedure well without immediate post procedural complication. FINDINGS: A total of approximately 4.2 L of  yellow fluid was removed. IMPRESSION: Successful ultrasound-guided paracentesis yielding 4.2 liters of peritoneal fluid. Electronically Signed   By: Irish Lack M.D.   On: 03/16/2017 10:46    EKG: Orders placed or performed during the hospital encounter of 03/08/17  . EKG 12 lead  . EKG 12 lead    IMPRESSION AND PLAN: 1 acute presumed SBP Admit to regular nursing floor bed, empiric cefepime, follow-up on peritoneal cultures from yesterday, gastroenterology consulted for expert opinion, check abdominal ultrasound as patient may require repeat paracentesis, adult pain protocol, continue close medical monitoring  2 chronic alcohol induced cirrhosis of the liver with associated ascites Continue Lasix, Spironolactone, and all other plans as stated above  3 chronic BPH without obstruction Stable Continue home regiment  4 chronic benign essential hypertension Stable, vitals per routine, make changes as per necessary  Full code Condition stable Prognosis poor DVT prophylaxis with heparin subcu Disposition Home in 1-2 days barring any complications  All the records are reviewed and case discussed with ED provider. Management plans discussed with the patient, family and they are in agreement.  CODE STATUS: Code Status History    Date Active Date Inactive Code Status Order ID Comments User Context   03/08/2017 23:25 03/09/2017 19:45 Full Code [id number]  Oralia Manis, MD Inpatient       TOTAL TIME TAKING CARE OF THIS PATIENT: 45 minutes.    Evelena Asa Giamarie Bueche M.D on 03/17/2017   Between 7am to 6pm - Pager - [phone]  After 6pm go to [email or web address] - Social research officer, government  [facility]  Office  [phone]  CC: Primary care physician; Center, [facility]   Note: This dictation was prepared with Dragon dictation along with smaller phrase technology. Any transcriptional errors that result from this process are unintentional.

## 2017-03-17 NOTE — ED Triage Notes (Signed)
Patient from home via POV. Patient was [facility] by [facility] at [facility] ED for evaluation for ABD distention and AMS

## 2017-03-18 ENCOUNTER — Inpatient Hospital Stay: Payer: Medicaid Other

## 2017-03-18 ENCOUNTER — Other Ambulatory Visit: Payer: Self-pay

## 2017-03-18 DIAGNOSIS — K7031 Alcoholic cirrhosis of liver with ascites: Principal | ICD-10-CM

## 2017-03-18 DIAGNOSIS — R188 Other ascites: Secondary | ICD-10-CM

## 2017-03-18 DIAGNOSIS — K652 Spontaneous bacterial peritonitis: Secondary | ICD-10-CM

## 2017-03-18 LAB — CBC WITH DIFFERENTIAL/PLATELET
BASOS ABS: 0.1 10*3/uL (ref 0–0.1)
Basophils Relative: 1 %
Eosinophils Absolute: 0.1 10*3/uL (ref 0–0.7)
Eosinophils Relative: 1 %
HEMATOCRIT: 38.8 % — AB (ref 40.0–52.0)
Hemoglobin: 13.5 g/dL (ref 13.0–18.0)
LYMPHS ABS: 3.7 10*3/uL — AB (ref 1.0–3.6)
LYMPHS PCT: 41 %
MCH: 34.4 pg — ABNORMAL HIGH (ref 26.0–34.0)
MCHC: 34.8 g/dL (ref 32.0–36.0)
MCV: 99 fL (ref 80.0–100.0)
MONO ABS: 1.4 10*3/uL — AB (ref 0.2–1.0)
Monocytes Relative: 15 %
NEUTROS ABS: 3.7 10*3/uL (ref 1.4–6.5)
Neutrophils Relative %: 42 %
Platelets: 212 10*3/uL (ref 150–440)
RBC: 3.92 MIL/uL — AB (ref 4.40–5.90)
RDW: 15.6 % — ABNORMAL HIGH (ref 11.5–14.5)
WBC: 8.9 10*3/uL (ref 3.8–10.6)

## 2017-03-18 LAB — CREATININE, SERUM
Creatinine, Ser: 0.73 mg/dL (ref 0.61–1.24)
GFR calc Af Amer: >60 mL/min (ref >60)
GFR calc non Af Amer: >60 mL/min (ref >60)

## 2017-03-18 MED ORDER — ALBUMIN HUMAN 25 % IV SOLN
125.0000 g | Freq: Once | INTRAVENOUS | Status: DC
  Filled 2017-03-18: qty 500

## 2017-03-18 MED ORDER — ALBUMIN HUMAN 25 % IV SOLN
25.0000 g | INTRAVENOUS | Status: AC
  Administered 2017-03-18 (×5): 25 g via INTRAVENOUS
  Filled 2017-03-18 (×5): qty 100

## 2017-03-18 NOTE — Progress Notes (Signed)
[employer] - [place] at [facility]                                                                                                                                                                                  Patient Demographics   Patrick [name], is a 61 y.o. male, DOB - 1955-07-15, NWG:[id number]  Admit date - 03/17/2017   Admitting Physician Bertrum Sol, MD  Outpatient Primary MD for the patient is [facility] [facility]   LOS - 1  Subjective: Patient admitted with abdominal pain and noted to have spontaneous bacterial peritonitis Of his abdominal pain is improved   Review of Systems:   CONSTITUTIONAL: No documented fever. No fatigue, weakness. No weight gain, no weight loss.  EYES: No blurry or double vision.  ENT: No tinnitus. No postnasal drip. No redness of the oropharynx.  RESPIRATORY: No cough, no wheeze, no hemoptysis. No dyspnea.  CARDIOVASCULAR: No chest pain. No orthopnea. No palpitations. No syncope.  GASTROINTESTINAL: No nausea, no vomiting or diarrhea. No abdominal pain. No melena or hematochezia.  GENITOURINARY: No dysuria or hematuria.  ENDOCRINE: No polyuria or nocturia. No heat or cold intolerance.  HEMATOLOGY: No anemia. No bruising. No bleeding.  INTEGUMENTARY: No rashes. No lesions.  MUSCULOSKELETAL: No arthritis. No swelling. No gout.  NEUROLOGIC: No numbness, tingling, or ataxia. No seizure-type activity.  PSYCHIATRIC: No anxiety. No insomnia. No ADD.    Vitals:   Vitals:   03/17/17 2230 03/17/17 2332 03/18/17 0459 03/18/17 0917  BP: 100/74 129/60 122/73 111/65  Pulse: 80 84 93 95  Resp: 11 16 16    Temp:  (!) 97.5 F (36.4 C) 98.4 F (36.9 C)   TempSrc:  Oral Oral   SpO2: 96% 97% 97%   Weight:  172 lb 6.4 oz (78.2 kg) 173 lb 14.4 oz (78.9 kg)   Height:  5\' 10"  (1.778 m)      Wt Readings from Last 3 Encounters:  03/18/17 173 lb 14.4 oz (78.9 kg)  03/08/17 176 lb 6.4 oz (80 kg)  03/08/17 164 lb (74.4 kg)      Intake/Output Summary (Last 24 hours) at 03/18/2017 1517 Last data filed at 03/18/2017 1418 Gross per 24 hour  Intake 495 ml  Output 200 ml  Net 295 ml    Physical Exam:   GENERAL: Pleasant-appearing in no apparent distress.  HEAD, EYES, EARS, NOSE AND THROAT: Atraumatic, normocephalic. Extraocular muscles are intact. Pupils equal and reactive to light. Sclerae anicteric. No conjunctival injection. No oro-pharyngeal erythema.  NECK: Supple. There is no jugular venous distention. No bruits, no lymphadenopathy, no thyromegaly.  HEART: Regular rate and rhythm,. No murmurs, no rubs, no clicks.  LUNGS: Clear to auscultation bilaterally. No rales or rhonchi. No wheezes.  ABDOMEN: Soft, flat, nontender, distended. Has good bowel sounds. No hepatosplenomegaly appreciated.  EXTREMITIES: No evidence of any cyanosis, clubbing, or peripheral edema.  +2 pedal and radial pulses bilaterally.  NEUROLOGIC: The patient is alert, awake, and oriented x3 with no focal motor or sensory deficits appreciated bilaterally.  SKIN: Moist and warm with no rashes appreciated.  Psych: Not anxious, depressed LN: No inguinal LN enlargement    Antibiotics   Anti-infectives (From admission, onward)   Start     Dose/Rate Route Frequency Ordered Stop   03/17/17 2345  ceFEPIme (MAXIPIME) 2 g in dextrose 5 % 50 mL IVPB  Status:  Discontinued     2 g 100 mL/hr over 30 Minutes Intravenous Every 8 hours 03/17/17 2332 03/17/17 2353   03/17/17 1830  cefTRIAXone (ROCEPHIN) 2 g in dextrose 5 % 50 mL IVPB     2 g 100 mL/hr over 30 Minutes Intravenous Every 24 hours 03/17/17 1816        Medications   Scheduled Meds: . finasteride  5 mg Oral Daily  . furosemide  40 mg Oral Daily  . heparin  5,000 Units Subcutaneous Q8H  . multivitamin with minerals  1 tablet Oral Daily  . pantoprazole  40 mg Oral Daily  . sodium chloride flush  3 mL Intravenous Q12H  . spironolactone  50 mg Oral Daily  . tamsulosin  0.4 mg Oral  QPC breakfast   Continuous Infusions: . sodium chloride    . albumin human    . cefTRIAXone (ROCEPHIN)  IV Stopped (03/17/17 2011)   PRN Meds:.sodium chloride, acetaminophen **OR** acetaminophen, HYDROcodone-acetaminophen, ondansetron **OR** ondansetron (ZOFRAN) IV, polyethylene glycol, sodium chloride flush   Data Review:   Micro Results Recent Results (from the past 240 hour(s))  Body fluid culture     Status: None   Collection Time: 03/08/17  7:40 PM  Result Value Ref Range Status   Specimen Description PERITONEAL  Final   Special Requests NONE  Final   Gram Stain   Final    RARE WBC PRESENT, PREDOMINANTLY MONONUCLEAR NO ORGANISMS SEEN    Culture   Final    No growth aerobically or anaerobically. Performed at [facility], [address] [place], [place] [address]    Report Status 03/12/2017 FINAL  Final  Body fluid culture     Status: None (Preliminary result)   Collection Time: 03/16/17  9:04 AM  Result Value Ref Range Status   Specimen Description   Final    PERITONEAL Performed at [facility], [address] [place], [place] [address]    Special Requests   Final    NONE Performed at [facility], [address] [place], [place] [address]    Gram Stain   Final    RARE WBC PRESENT,BOTH PMN AND MONONUCLEAR NO ORGANISMS SEEN    Culture   Final    NO GROWTH 2 DAYS Performed at [facility], [address], [place], [place] [address]    Report Status PENDING  Incomplete  Aerobic/Anaerobic Culture (surgical/deep wound)     Status: None (Preliminary result)   Collection Time: 03/16/17  9:04 AM  Result Value Ref Range Status   Specimen Description   Final    PERITONEAL Performed at [facility], [address], [place], [place] [address]    Special Requests   Final    NONE Performed at [facility], [address] [place], [place] [address]    Gram Stain   Final    RARE WBC PRESENT,BOTH PMN AND  MONONUCLEAR NO ORGANISMS SEEN    Culture   Final    NO GROWTH 2 DAYS Performed at [facility], [address] [place], [place] [address]    Report Status PENDING  Incomplete  CULTURE, BLOOD (ROUTINE X 2) w Reflex to ID Panel     Status: None (Preliminary result)   Collection Time: 03/17/17 11:43 PM  Result Value Ref Range Status   Specimen Description BLOOD RIGHT ANTECUBITAL  Final   Special Requests   Final    BOTTLES DRAWN AEROBIC AND ANAEROBIC Blood Culture adequate volume   Culture   Final    NO GROWTH < 12 HOURS Performed at [facility], [address] [place], [place] [address]    Report Status PENDING  Incomplete  CULTURE, BLOOD (ROUTINE X 2) w Reflex to ID Panel     Status: None (Preliminary result)   Collection Time: 03/17/17 11:44 PM  Result Value Ref Range Status   Specimen Description BLOOD RIGHT ANTECUBITAL  Final   Special Requests   Final    BOTTLES DRAWN AEROBIC AND ANAEROBIC Blood Culture adequate volume   Culture   Final    NO GROWTH < 12 HOURS Performed at [facility], [address] [place], [place] [address]    Report Status PENDING  Incomplete    Radiology Reports Mr Liver W Wo Contrast  Result Date: 03/09/2017 CLINICAL DATA:  Abnormal liver function tests. Recently diagnosed with cirrhosis. Recent paracentesis. EXAM: MRI ABDOMEN WITHOUT AND WITH CONTRAST TECHNIQUE: Multiplanar multisequence MR imaging of the abdomen was performed both before and after the administration of intravenous contrast. CONTRAST:  15 cc MultiHance COMPARISON:  03/08/2017 abdominal ultrasound.  01/16/2017 CT FINDINGS: Mild to moderate motion degradation throughout. Lower chest: Normal heart size without pericardial or pleural effusion. Hepatobiliary: Hepatomegaly, 20.0 cm craniocaudal. No significant steatosis. No definite evidence of cirrhosis. Normal gallbladder, without biliary ductal dilatation. Pancreas:  Normal, without mass or ductal dilatation.  Spleen:  Normal in size, without focal abnormality. Adrenals/Urinary Tract: Normal adrenal glands. Normal kidneys, without hydronephrosis. Stomach/Bowel: Normal stomach and abdominal bowel loops. Vascular/Lymphatic: Aortic and branch vessel atherosclerosis. Patent portal and splenic veins. Other: Small volume ascites, relatively similar to 01/16/2017. Anasarca. Musculoskeletal: No acute osseous abnormality. IMPRESSION: 1. Motion degradation. 2. Hepatomegaly. No definite explanation for elevated liver function tests. 3. Small volume ascites, similar. Electronically Signed   By: Jeronimo GreavesKyle  Talbot M.D.   On: 03/09/2017 12:10   [place] Abdomen Limited  Result Date: 03/18/2017 CLINICAL DATA:  Cirrhosis. Ascites. Last paracentesis of 03/16/2017. EXAM: LIMITED ABDOMEN ULTRASOUND FOR ASCITES TECHNIQUE: Limited ultrasound survey for ascites was performed in all four abdominal quadrants. COMPARISON:  03/16/2017 FINDINGS: Moderate ascites distends the abdomen, noted in all 4 quadrants. IMPRESSION: Moderate ascites throughout the abdomen. Electronically Signed   By: Amie Portlandavid  Ormond M.D.   On: 03/18/2017 09:42   [place] Paracentesis  Result Date: 03/16/2017 INDICATION: Cirrhosis and ascites. EXAM: ULTRASOUND GUIDED PARACENTESIS MEDICATIONS: None. COMPLICATIONS: None immediate. PROCEDURE: Informed written consent was obtained from the patient after a discussion of the risks, benefits and alternatives to treatment. A timeout was performed prior to the initiation of the procedure. Initial ultrasound scanning was performed to localize ascites. The left lower abdomen was prepped and draped in the usual sterile fashion. 1% lidocaine was used for local anesthesia. Following this, a 6  Fr Safe-T-Centesis catheter was introduced. An ultrasound image was saved for documentation purposes. The paracentesis was performed. The catheter was removed and a dressing was applied. The patient tolerated the procedure well without immediate post procedural  complication. FINDINGS: A total of approximately 4.2 L of yellow fluid was removed. IMPRESSION: Successful ultrasound-guided paracentesis yielding 4.2 liters of peritoneal fluid. Electronically Signed   By: Irish Lack M.D.   On: 03/16/2017 10:46   US Abdomen Limited Ruq  Result Date: 03/08/2017 CLINICAL DATA:  Elevated bilirubin. EXAM: ULTRASOUND ABDOMEN LIMITED RIGHT UPPER QUADRANT COMPARISON:  CT abdomen 01/16/2017. FINDINGS: Gallbladder: Thick-walled gallbladder, up to 4.6 mm, without visible stones or sludge. Negative sonographic Murphy's sign. No pericholecystic fluid. Common bile duct: Diameter: Upper limits normal, 5.5 mm. Liver: Nodular contour of the liver, with increased echotexture, suggesting cirrhosis. No focal lesions. Portal vein is patent on color Doppler imaging with normal direction of blood flow towards the liver. Incidental note is made of moderate ascites. IMPRESSION: Findings consistent with cirrhosis of the liver, with probable secondary ascites. No gallstones, but thick-walled gallbladder, almost 5 mm. No biliary ductal dilatation. Electronically Signed   By: Elsie Stain M.D.   On: 03/08/2017 18:48     CBC Recent Labs  Lab 03/17/17 1736 03/17/17 2344  WBC 8.7 8.9  HGB 13.9 13.5  HCT 40.6 38.8*  PLT 221 212  MCV 100.0 99.0  MCH 34.3* 34.4*  MCHC 34.3 34.8  RDW 15.5* 15.6*  LYMPHSABS 3.1 3.7*  MONOABS 1.3* 1.4*  EOSABS 0.1 0.1  BASOSABS 0.0 0.1    Chemistries  Recent Labs  Lab 03/17/17 1736 03/17/17 2344  NA 128*  --   K 5.1  --   CL 96*  --   CO2 22  --   GLUCOSE 155*  --   BUN 16  --   CREATININE 0.83 0.73  CALCIUM 8.7*  --   AST 106*  --   ALT 48  --   ALKPHOS 990*  --   BILITOT 7.9*  --    ------------------------------------------------------------------------------------------------------------------ estimated creatinine clearance is 100.1 mL/min (by C-G formula based on SCr of 0.73  mg/dL). ------------------------------------------------------------------------------------------------------------------ No results for input(s): HGBA1C in the last 72 hours. ------------------------------------------------------------------------------------------------------------------ No results for input(s): CHOL, HDL, LDLCALC, TRIG, CHOLHDL, LDLDIRECT in the last 72 hours. ------------------------------------------------------------------------------------------------------------------ No results for input(s): TSH, T4TOTAL, T3FREE, THYROIDAB in the last 72 hours.  Invalid input(s): FREET3 ------------------------------------------------------------------------------------------------------------------ No results for input(s): VITAMINB12, FOLATE, FERRITIN, TIBC, IRON, RETICCTPCT in the last 72 hours.  Coagulation profile Recent Labs  Lab 03/17/17 1736  INR 1.10    No results for input(s): DDIMER in the last 72 hours.  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  Hyponatremia related to liver cirrhosis and ascites continue to follow      Code Status Orders  (From admission, onward)        Start  Ordered   03/17/17 2333  Full code   Continuous     03/17/17 2332    Code Status History    Date Active Date Inactive Code Status Order ID Comments User Context   03/08/2017 23:25 03/09/2017 19:45 Full Code [id number]  Oralia ManisWillis, David, MD Inpatient           Consults neurology   DVT Prophylaxis SCDs Lab Results  Component Value Date   PLT 212 03/17/2017     Time Spent in minutes 35 minutes  Greater than 50% of time spent in care coordination and counseling patient regarding the condition and plan of care.   Auburn BilberryPATEL, Shannyn Jankowiak M.D on 03/18/2017 at 3:17 PM  Between 7am to 6pm - Pager - [phone]  After 6pm go to [email or web address] - password [email or web address]  [facility]   Office  [phone]

## 2017-03-18 NOTE — Consult Note (Addendum)
Patrick MoodKiran Bassy Fetterly , MD [address], [place], [place], [address] [address], [place], [place], [address] Phone: [phone]  Fax: [phone]  Consultation  Referring Provider:     Dr Allena KatzPatel  Primary Care Physician:  [facility] Primary Gastroenterologist:  Dr. Marva PandaSkulskie         Reason for Consultation:   Ascites   Date of Admission:  03/17/2017 Date of Consultation:  03/18/2017         HPI:   Dereck LigasWeldon Mizrachi is a 61 y.o. male   Summary of history : Dereck LigasWeldon Volkov is a 61 y.o. male who has been a patient of [facility].  Last seen them a few days back .  He has a history of alcoholic liver cirrhosis .  CT scan of the abdomen was obtained on 01/16/2017 and features suggestive of cirrhosis were noted.  Small volume of ascites were noted.  Markedly thickened urinary bladder with 2 diverticuli and focus of mural nodularity were also noted. Non compliant to follow up.  He was subsequently seen by urology on 02/10/2017.  There is concern for underlying mass in the urinary bladder.  recently admitted on 03/08/2017 with abdominal pain and distention.  On admission his hemoglobin is 13.1 g with a platelet count of 209.  Elevated LFT's   An ultrasound of the abdomen performed showed features of cirrhosis with probable secondary ascites the gallbladder wall almost 5 mm no biliary ductal dilation seen.    MRCP 03/09/17- small volume ascites , hepatomegaly .  Underwent paracentesis 03/16/17 and 4.2 litres taken out. WCC 792 with neutrophil count of 85%. Culture negative.   He was subsequently contacted to get admitted for SBP. Denies any fever, he has some abdominal pain. No other complaints./     Past Medical History:  Diagnosis Date  . Aortic atherosclerosis (HCC) 12/2016  . Arthritis   . BPH (benign prostatic hyperplasia)   . Cirrhosis of liver (HCC)   . Depression   . HTN (hypertension)   . Metatarsal fracture    2nd toe of left foot  .  Tuberculosis   . Urinary frequency     Past Surgical History:  Procedure Laterality Date  . NO PAST SURGERIES    . None      Prior to Admission medications   Medication Sig Start Date End Date Taking? Authorizing Provider  finasteride (PROSCAR) 5 MG tablet Take 1 tablet (5 mg total) daily by mouth. 02/10/17   Vanna ScotlandBrandon, Ashley, MD  furosemide (LASIX) 20 MG tablet Take 2 tablets (40 mg total) by mouth daily. 03/10/17   Enedina FinnerPatel, Sona, MD  pantoprazole (PROTONIX) 40 MG tablet Take 40 mg by mouth daily.  01/16/17   [provider]  spironolactone (ALDACTONE) 50 MG tablet Take 1 tablet (50 mg total) by mouth daily. 03/10/17   Enedina FinnerPatel, Sona, MD  tamsulosin (FLOMAX) 0.4 MG CAPS capsule Take 1 capsule (0.4 mg total) daily after breakfast by mouth. 02/10/17   Vanna ScotlandBrandon, Ashley, MD    Family History  Problem Relation Age of Onset  . Hematuria Unknown   . Tuberculosis Unknown   . Diabetes Mother   . Hypertension Mother   . Kidney disease Neg Hx   . Prostate cancer Neg Hx      Social History   Tobacco Use  . Smoking status: Current Every Day Smoker    Packs/day: 0.50  . Smokeless tobacco: Never Used  Substance Use Topics  . Alcohol use: Yes  Alcohol/week: 0.0 oz    Comment: daily use up until October 2018  . Drug use: No    Allergies as of 03/17/2017  . (No Known Allergies)    Review of Systems:    All systems reviewed and negative except where noted in HPI.   Physical Exam:  Vital signs in last 24 hours: Temp:  [97.5 F (36.4 C)-98.4 F (36.9 C)] 98.4 F (36.9 C) (12/22 0459) Pulse Rate:  [80-95] 93 (12/22 0459) Resp:  [10-16] 16 (12/22 0459) BP: (100-129)/(57-80) 122/73 (12/22 0459) SpO2:  [96 %-98 %] 97 % (12/22 0459) Weight:  [172 lb 6.4 oz (78.2 kg)-176 lb (79.8 kg)] 173 lb 14.4 oz (78.9 kg) (12/22 0459) Last BM Date: 03/17/17 General:   Pleasant, cooperative in NAD Head:  Normocephalic and atraumatic. Eyes:   No icterus.   Conjunctiva pink. PERRLA. Ears:   Normal auditory acuity. Neck:  Supple; no masses or thyroidomegaly Lungs: Respirations even and unlabored. Lungs clear to auscultation bilaterally.   No wheezes, crackles, or rhonchi.  Heart:  Regular rate and rhythm;  Without murmur, clicks, rubs or gallops Abdomen:  Soft, distended, tense ascites,  nontender. Normal bowel sounds. No appreciable masses or hepatomegaly.  No rebound or guarding.  Neurologic:  Alert and oriented x3;  grossly normal neurologically. Skin:  Intact without significant lesions or rashes. Cervical Nodes:  No significant cervical adenopathy. Psych:  Alert and cooperative. Normal affect.  LAB RESULTS: Recent Labs    03/17/17 1736 03/17/17 2344  WBC 8.7 8.9  HGB 13.9 13.5  HCT 40.6 38.8*  PLT 221 212   BMET Recent Labs    03/17/17 1736 03/17/17 2344  NA 128*  --   K 5.1  --   CL 96*  --   CO2 22  --   GLUCOSE 155*  --   BUN 16  --   CREATININE 0.83 0.73  CALCIUM 8.7*  --    LFT Recent Labs    03/17/17 1736  PROT 7.3  ALBUMIN 2.6*  AST 106*  ALT 48  ALKPHOS 990*  BILITOT 7.9*   PT/INR Recent Labs    03/17/17 1736  LABPROT 14.1  INR 1.10    STUDIES: [place] Paracentesis  Result Date: 03/16/2017 INDICATION: Cirrhosis and ascites. EXAM: ULTRASOUND GUIDED PARACENTESIS MEDICATIONS: None. COMPLICATIONS: None immediate. PROCEDURE: Informed written consent was obtained from the patient after a discussion of the risks, benefits and alternatives to treatment. A timeout was performed prior to the initiation of the procedure. Initial ultrasound scanning was performed to localize ascites. The left lower abdomen was prepped and draped in the usual sterile fashion. 1% lidocaine was used for local anesthesia. Following this, a 6 Fr Safe-T-Centesis catheter was introduced. An ultrasound image was saved for documentation purposes. The paracentesis was performed. The catheter was removed and a dressing was applied. The patient tolerated the procedure well  without immediate post procedural complication. FINDINGS: A total of approximately 4.2 L of yellow fluid was removed. IMPRESSION: Successful ultrasound-guided paracentesis yielding 4.2 liters of peritoneal fluid. Electronically Signed   By: Irish LackGlenn  Yamagata M.D.   On: 03/16/2017 10:46      Impression / Plan:   Dereck LigasWeldon Scullin is a 61 y.o. y/o male with history of alcoholic liver cirrhosis.  He has been admitted for spontaneous bacterial peritonitis.   1.  Continue antibiotics for 5 days continue follow cultures.   At the end of treatment of 5 days of antibiotics the patient would be assessed and if he  is doing well the antibiotics can be discontinued however if the patient has fever or pain a repeat paracentesis would be indicated and then subsequently antibiotics would have to be decided based on the result.  Watch for renal failure which can be a cause of death in people with spontaneous bacterial peritonitis.  Suggest infusion of intravenous albumin 1.5 g/kg body weight today and repeat 1 g/kg body weight on day 3 if he develops renal failure then would need to start on octreotide and midodrine as well at that point of time in addition to albumin.  His overall long-term prognosis is poor mortality can be as high as 20-40% and 1 and 2-year mortality rates are between 70-80%.  As an outpatient after completing his antibiotics you require lifelong antibiotic prophylaxis with either ciprofloxacin or Bactrim.  2.  He will need an upper endoscopy as an outpatient to screen for esophageal varices when discharged.  Continue abstinence from alcohol.  3. Low salt diet < 2 grams of sodium per day   4. Doppler USg to r/o portal vein thrombosis.   5. Monitor CMP  6. Ascites- avoid normal saline infusions, limit dietary sodium, can drain ascites further with paracentesis , if taking out more than 5L give 25 grams albumin . Once he finishes antibiotics can gradually add diuretics. Daily weights    LOS: 1 day     Patrick Mood, MD  03/18/2017, 9:08 AM

## 2017-03-19 ENCOUNTER — Inpatient Hospital Stay: Payer: Medicaid Other

## 2017-03-19 LAB — BODY FLUID CELL COUNT WITH DIFFERENTIAL
Eos, Fluid: 0 %
Lymphs, Fluid: 44 %
Monocyte-Macrophage-Serous Fluid: 11 %
Neutrophil Count, Fluid: 44 %
Other Cells, Fluid: 1 %
WBC FLUID: 248 uL

## 2017-03-19 LAB — BODY FLUID CULTURE: CULTURE: NO GROWTH

## 2017-03-19 NOTE — Procedures (Signed)
PROCEDURE SUMMARY:  Successful US guided paracentesis from RLQ.  Yielded 3.8 L of clear yellow fluid.  No immediate complications.  Pt tolerated well.   Specimen was sent for labs.  Brayton ElBRUNING, Dylin Ihnen PA-C 03/19/2017 2:31 PM

## 2017-03-19 NOTE — Plan of Care (Signed)
Pts pain has decreased. Abdomen is still tight and distended. Pt went down for paracentesis today.

## 2017-03-19 NOTE — Progress Notes (Signed)
Wyline Mood , MD [address], [place], [place], [address] [address]40 [address], [place], [place], [address] Phone: [phone]  Fax: [phone]   Patrick [name] is being followed for SBP  Day 1 of follow up   Subjective: Feels well , no abdominal pain , denies fever    Objective: Vital signs in last 24 hours: Vitals:   03/18/17 2011 03/19/17 0527 03/19/17 0543 03/19/17 0550  BP: (!) 108/52  (!) 88/54 124/62  Pulse: 96  86 83  Resp: 16  16   Temp: 97.9 F (36.6 C)  97.7 F (36.5 C)   TempSrc: Oral  Oral   SpO2: 97%  94%   Weight:  173 lb 8 oz (78.7 kg)    Height:       Weight change: -8 oz (-1.134 kg)  Intake/Output Summary (Last 24 hours) at 03/19/2017 1106 Last data filed at 03/19/2017 1017 Gross per 24 hour  Intake 1045 ml  Output -  Net 1045 ml     Exam: Heart:: Regular rate and rhythm, S1S2 present or without murmur or extra heart sounds Lungs: normal, clear to auscultation and clear to auscultation and percussion Abdomen: distended, soft non tender, bs +, tense ascites   Lab Results: @LABTEST2 @ Micro Results: Recent Results (from the past 240 hour(s))  Body fluid culture     Status: None   Collection Time: 03/16/17  9:04 AM  Result Value Ref Range Status   Specimen Description   Final    PERITONEAL Performed at [facility], [address] [place], [place] [address]    Special Requests   Final    NONE Performed at [facility], [address] [place], [place] [address]    Gram Stain   Final    RARE WBC PRESENT,BOTH PMN AND MONONUCLEAR NO ORGANISMS SEEN    Culture   Final    NO GROWTH 3 DAYS Performed at [facility], [address] [place], [place] [address]    Report Status 03/19/2017 FINAL  Final  Aerobic/Anaerobic Culture (surgical/deep wound)     Status: None (Preliminary result)   Collection Time: 03/16/17  9:04 AM  Result Value Ref Range Status   Specimen Description   Final   PERITONEAL Performed at [facility], [address], [place], [place] [address]    Special Requests   Final    NONE Performed at [facility], [address] [place], [place] [address]    Gram Stain   Final    RARE WBC PRESENT,BOTH PMN AND MONONUCLEAR NO ORGANISMS SEEN    Culture   Final    NO GROWTH 2 DAYS Performed at [facility], [address], [place], [place] [address]    Report Status PENDING  Incomplete  CULTURE, BLOOD (ROUTINE X 2) w Reflex to ID Panel     Status: None (Preliminary result)   Collection Time: 03/17/17 11:43 PM  Result Value Ref Range Status   Specimen Description BLOOD RIGHT ANTECUBITAL  Final   Special Requests   Final    BOTTLES DRAWN AEROBIC AND ANAEROBIC Blood Culture adequate volume   Culture   Final    NO GROWTH 1 DAY Performed at [facility], [address] [place], [place] [address]    Report Status PENDING  Incomplete  CULTURE, BLOOD (ROUTINE X 2) w Reflex to ID Panel     Status: None (Preliminary result)   Collection Time: 03/17/17 11:44  PM  Result Value Ref Range Status   Specimen Description BLOOD RIGHT ANTECUBITAL  Final   Special Requests   Final    BOTTLES DRAWN AEROBIC AND ANAEROBIC Blood Culture adequate volume   Culture   Final    NO GROWTH 1 DAY Performed at [facility], [address] [place], [place] [address]    Report Status PENDING  Incomplete   Studies/Results: Koreas Abdomen Limited  Result Date: 03/18/2017 CLINICAL DATA:  Cirrhosis. Ascites. Last paracentesis of 03/16/2017. EXAM: LIMITED ABDOMEN ULTRASOUND FOR ASCITES TECHNIQUE: Limited ultrasound survey for ascites was performed in all four abdominal quadrants. COMPARISON:  03/16/2017 FINDINGS: Moderate ascites distends the abdomen, noted in all 4 quadrants. IMPRESSION: Moderate ascites throughout the abdomen. Electronically Signed   By: Amie Portlandavid  Ormond M.D.   On: 03/18/2017 09:42   Koreas Abdominal Pelvic Art/vent Flow  Doppler  Result Date: 03/18/2017 CLINICAL DATA:  Ascites. EXAM: DUPLEX ULTRASOUND OF LIVER TECHNIQUE: Color and duplex Doppler ultrasound was performed to evaluate the hepatic in-flow and out-flow vessels. COMPARISON:  None. FINDINGS: Portal Vein Velocities Main:  26 cm/sec Right:  7 cm/sec Left:  8 cm/sec Hepatic Vein Velocities Right:  22 cm/sec Middle:  12 cm/sec Left:  6 cm/sec Hepatic Artery Velocity:  260 cm/sec Splenic Vein Velocity:  21 cm/sec Varices: None identified. Ascites: Small to moderate volume ascites present. There is no evidence of portal vein thrombus in the main portal vein appears normal in caliber. Portal vein flow is hepatopetal. Although the main portal vein velocity is not significantly reduced, intrahepatic portal vein velocity does appear to be significantly reduced and elevated hepatic artery velocities support underlying portal hypertension. Portal vein waveforms are somewhat pulsatile in appearance at the level of the main portal vein. However, there does appear to be some likely interference with adjacent hepatic arterial transmission. Intrahepatic portal vein waveforms are not pulsatile. Pulsatile portal flow can be associated with right-sided heart failure, tricuspid regurgitation and cirrhosis with arterioportal shunting. No evidence of splenic vein thrombosis. No evidence of hepatic veno-occlusive disease. Venous waveforms are normal. IMPRESSION: Although main portal vein velocities are normal, significant reduction in intrahepatic portal vein velocities and corresponding high velocity in the hepatic artery are suggestive of some degree of underlying portal hypertension. No evidence of portal vein thrombus, reversed portal vein flow or portal vein dilatation. Electronically Signed   By: Irish LackGlenn  Yamagata M.D.   On: 03/18/2017 15:28   Medications: I have reviewed the patient's current medications. Scheduled Meds: . finasteride  5 mg Oral Daily  . furosemide  40 mg Oral Daily   . heparin  5,000 Units Subcutaneous Q8H  . multivitamin with minerals  1 tablet Oral Daily  . pantoprazole  40 mg Oral Daily  . sodium chloride flush  3 mL Intravenous Q12H  . spironolactone  50 mg Oral Daily  . tamsulosin  0.4 mg Oral QPC breakfast   Continuous Infusions: . sodium chloride    . cefTRIAXone (ROCEPHIN)  IV Stopped (03/18/17 1821)   PRN Meds:.sodium chloride, acetaminophen **OR** acetaminophen, HYDROcodone-acetaminophen, ondansetron **OR** ondansetron (ZOFRAN) IV, polyethylene glycol, sodium chloride flush   Assessment: Active Problems:   Cirrhosis of liver (HCC)   Patrick [name] is a 61 y.o. y/o male with history of alcoholic liver cirrhosis.  He has been admitted for spontaneous bacterial peritonitis.   1.  Continue antibiotics for 5 days continue follow cultures.   At the end of treatment of 5 days of antibiotics the patient would need to be assessed  and if he is doing well the antibiotics can be discontinued however if the patient has fever or pain a repeat paracentesis would be indicated and then subsequently antibiotics would have to be decided based on the result.  Watch for renal failure which can be a cause of death in people with spontaneous bacterial peritonitis.  Suggest infusion of intravenous albumin  1 g/kg body weight on day 3 , ie tomorrow if he develops renal failure then would need to start on octreotide and midodrine as well at that point of time in addition to albumin.  His overall long-term prognosis is poor mortality can be as high as 20-40% and 1 and 2-year mortality rates are between 70-80%.  As an outpatient after completing his antibiotics you require lifelong antibiotic prophylaxis with either ciprofloxacin or Bactrim.No portal vein thrombosis on dopplers 03/19/17 .   2.  He will need an upper endoscopy as an outpatient to screen for esophageal varices when discharged.  Continue abstinence from alcohol.  3. Low salt diet < 2 grams of sodium  per day   4.  Monitor CMP  5. Ascites- avoid normal saline infusions, limit dietary sodium, can drain ascites further with paracentesis , if taking out more than 5L give 25 grams albumin . Once he finishes antibiotics can gradually add diuretics. Daily weights      LOS: 2 days   Wyline MoodKiran Daylynn Stumpp, MD 03/19/2017, 11:06 AM

## 2017-03-19 NOTE — Progress Notes (Signed)
[facility] [place] [facility]                                                                                                                                                                                  Patient Demographics   Patrick [name], is a 61 y.o. male, DOB - Jun 01, 1955, JYN:[id number]  Admit date - 03/17/2017   Admitting Physician Bertrum SolMontell D Salary, MD  Outpatient Primary MD for the patient is [facility]   LOS - 2  Subjective: Patient's abdomen is swollen   Review of Systems:   CONSTITUTIONAL: No documented fever. No fatigue, weakness. No weight gain, no weight loss.  EYES: No blurry or double vision.  ENT: No tinnitus. No postnasal drip. No redness of the oropharynx.  RESPIRATORY: No cough, no wheeze, no hemoptysis. No dyspnea.  CARDIOVASCULAR: No chest pain. No orthopnea. No palpitations. No syncope.  GASTROINTESTINAL: No nausea, no vomiting or diarrhea. No abdominal pain. No melena or hematochezia.  GENITOURINARY: No dysuria or hematuria.  ENDOCRINE: No polyuria or nocturia. No heat or cold intolerance.  HEMATOLOGY: No anemia. No bruising. No bleeding.  INTEGUMENTARY: No rashes. No lesions.  MUSCULOSKELETAL: No arthritis. No swelling. No gout.  NEUROLOGIC: No numbness, tingling, or ataxia. No seizure-type activity.  PSYCHIATRIC: No anxiety. No insomnia. No ADD.    Vitals:   Vitals:   03/19/17 0543 03/19/17 0550 03/19/17 1200 03/19/17 1331  BP: (!) 88/54 124/62 128/66 126/72  Pulse: 86 83 88 91  Resp: 16  18 18   Temp: 97.7 F (36.5 C)  97.9 F (36.6 C)   TempSrc: Oral  Oral   SpO2: 94%  95% 96%  Weight:      Height:        Wt Readings from Last 3 Encounters:  03/19/17 173 lb 8 oz (78.7 kg)  03/08/17 176 lb 6.4 oz (80 kg)  03/08/17 164 lb (74.4 kg)     Intake/Output Summary (Last 24 hours) at 03/19/2017 1400 Last data filed at 03/19/2017 1017 Gross per 24 hour  Intake 960 ml  Output -  Net 960 ml     Physical Exam:   GENERAL: Pleasant-appearing in no apparent distress.  HEAD, EYES, EARS, NOSE AND THROAT: Atraumatic, normocephalic. Extraocular muscles are intact. Pupils equal and reactive to light. Sclerae anicteric. No conjunctival injection. No oro-pharyngeal erythema.  NECK: Supple. There is no jugular venous distention. No bruits, no lymphadenopathy, no thyromegaly.  HEART: Regular rate and rhythm,. No murmurs, no rubs, no clicks.  LUNGS: Clear to auscultation bilaterally. No rales or rhonchi. No wheezes.  ABDOMEN: Soft, flat, nontender, positive distended. Has good bowel sounds. No hepatosplenomegaly appreciated.  EXTREMITIES:  No evidence of any cyanosis, clubbing, or peripheral edema.  +2 pedal and radial pulses bilaterally.  NEUROLOGIC: The patient is alert, awake, and oriented x3 with no focal motor or sensory deficits appreciated bilaterally.  SKIN: Moist and warm with no rashes appreciated.  Psych: Not anxious, depressed LN: No inguinal LN enlargement    Antibiotics   Anti-infectives (From admission, onward)   Start     Dose/Rate Route Frequency Ordered Stop   03/17/17 2345  ceFEPIme (MAXIPIME) 2 g in dextrose 5 % 50 mL IVPB  Status:  Discontinued     2 g 100 mL/hr over 30 Minutes Intravenous Every 8 hours 03/17/17 2332 03/17/17 2353   03/17/17 1830  cefTRIAXone (ROCEPHIN) 2 g in dextrose 5 % 50 mL IVPB     2 g 100 mL/hr over 30 Minutes Intravenous Every 24 hours 03/17/17 1816        Medications   Scheduled Meds: . finasteride  5 mg Oral Daily  . furosemide  40 mg Oral Daily  . heparin  5,000 Units Subcutaneous Q8H  . multivitamin with minerals  1 tablet Oral Daily  . pantoprazole  40 mg Oral Daily  . sodium chloride flush  3 mL Intravenous Q12H  . spironolactone  50 mg Oral Daily  . tamsulosin  0.4 mg Oral QPC breakfast   Continuous Infusions: . sodium chloride    . cefTRIAXone (ROCEPHIN)  IV Stopped (03/18/17 1821)   PRN Meds:.sodium chloride,  acetaminophen **OR** acetaminophen, HYDROcodone-acetaminophen, ondansetron **OR** ondansetron (ZOFRAN) IV, polyethylene glycol, sodium chloride flush   Data Review:   Micro Results Recent Results (from the past 240 hour(s))  Body fluid culture     Status: None   Collection Time: 03/16/17  9:04 AM  Result Value Ref Range Status   Specimen Description   Final    PERITONEAL Performed at [facility], [address], [place], [place] [address]    Special Requests   Final    NONE Performed at [facility], [address] [place], [place] [address]    Gram Stain   Final    RARE WBC PRESENT,BOTH PMN AND MONONUCLEAR NO ORGANISMS SEEN    Culture   Final    NO GROWTH 3 DAYS Performed at [facility], [address], [place], [place] [address]    Report Status 03/19/2017 FINAL  Final  Aerobic/Anaerobic Culture (surgical/deep wound)     Status: None (Preliminary result)   Collection Time: 03/16/17  9:04 AM  Result Value Ref Range Status   Specimen Description   Final    PERITONEAL Performed at [facility], [address], [place], [place] [address]    Special Requests   Final    NONE Performed at [facility], [address] [place], [place] [address]    Gram Stain   Final    RARE WBC PRESENT,BOTH PMN AND MONONUCLEAR NO ORGANISMS SEEN    Culture   Final    NO GROWTH 3 DAYS NO ANAEROBES ISOLATED; CULTURE IN PROGRESS FOR 5 DAYS Performed at [facility], [address] [place], [place] [address]    Report Status PENDING  Incomplete  CULTURE, BLOOD (ROUTINE X 2) w Reflex to ID Panel     Status: None (Preliminary result)   Collection Time: 03/17/17 11:43 PM  Result Value Ref Range Status   Specimen Description BLOOD RIGHT ANTECUBITAL  Final   Special Requests   Final    BOTTLES DRAWN AEROBIC  AND ANAEROBIC Blood Culture adequate volume   Culture   Final    NO GROWTH 1 DAY Performed at [facility], [address] [place], [place] [address]    Report Status PENDING  Incomplete  CULTURE, BLOOD (ROUTINE X 2) w Reflex to ID Panel     Status: None (Preliminary result)   Collection Time: 03/17/17 11:44 PM  Result Value Ref Range Status   Specimen Description BLOOD RIGHT ANTECUBITAL  Final   Special Requests   Final    BOTTLES DRAWN AEROBIC AND ANAEROBIC Blood Culture adequate volume   Culture   Final    NO GROWTH 1 DAY Performed at [facility], [address] [place], [place] [address]    Report Status PENDING  Incomplete    Radiology Reports Mr Liver W Wo Contrast  Result Date: 03/09/2017 CLINICAL DATA:  Abnormal liver function tests. Recently diagnosed with cirrhosis. Recent paracentesis. EXAM: MRI ABDOMEN WITHOUT AND WITH CONTRAST TECHNIQUE: Multiplanar multisequence MR imaging of the abdomen was performed both before and after the administration of intravenous contrast. CONTRAST:  15 cc MultiHance COMPARISON:  03/08/2017 abdominal ultrasound.  01/16/2017 CT FINDINGS: Mild to moderate motion degradation throughout. Lower chest: Normal heart size without pericardial or pleural effusion. Hepatobiliary: Hepatomegaly, 20.0 cm craniocaudal. No significant steatosis. No definite evidence of cirrhosis. Normal gallbladder, without biliary ductal dilatation. Pancreas:  Normal, without mass or ductal dilatation. Spleen:  Normal in size, without focal abnormality. Adrenals/Urinary Tract: Normal adrenal glands. Normal kidneys, without hydronephrosis. Stomach/Bowel: Normal stomach and abdominal bowel loops. Vascular/Lymphatic: Aortic and branch vessel atherosclerosis. Patent portal and splenic veins. Other: Small volume ascites, relatively similar to 01/16/2017. Anasarca. Musculoskeletal: No acute osseous abnormality. IMPRESSION: 1. Motion degradation. 2. Hepatomegaly. No definite explanation for elevated liver function tests. 3. Small volume ascites, similar. Electronically Signed   By: Jeronimo Greaves M.D.    On: 03/09/2017 12:10   US Abdomen Limited  Result Date: 03/18/2017 CLINICAL DATA:  Cirrhosis. Ascites. Last paracentesis of 03/16/2017. EXAM: LIMITED ABDOMEN ULTRASOUND FOR ASCITES TECHNIQUE: Limited ultrasound survey for ascites was performed in all four abdominal quadrants. COMPARISON:  03/16/2017 FINDINGS: Moderate ascites distends the abdomen, noted in all 4 quadrants. IMPRESSION: Moderate ascites throughout the abdomen. Electronically Signed   By: Amie Portland M.D.   On: 03/18/2017 09:42   US Paracentesis  Result Date: 03/16/2017 INDICATION: Cirrhosis and ascites. EXAM: ULTRASOUND GUIDED PARACENTESIS MEDICATIONS: None. COMPLICATIONS: None immediate. PROCEDURE: Informed written consent was obtained from the patient after a discussion of the risks, benefits and alternatives to treatment. A timeout was performed prior to the initiation of the procedure. Initial ultrasound scanning was performed to localize ascites. The left lower abdomen was prepped and draped in the usual sterile fashion. 1% lidocaine was used for local anesthesia. Following this, a 6 Fr Safe-T-Centesis catheter was introduced. An ultrasound image was saved for documentation purposes. The paracentesis was performed. The catheter was removed and a dressing was applied. The patient tolerated the procedure well without immediate post procedural complication. FINDINGS: A total of approximately 4.2 L of yellow fluid was removed. IMPRESSION: Successful ultrasound-guided paracentesis yielding 4.2 liters of peritoneal fluid. Electronically Signed   By: Irish Lack M.D.   On: 03/16/2017 10:46   US Abdominal Pelvic Art/vent Flow Doppler  Result Date: 03/18/2017 CLINICAL DATA:  Ascites. EXAM: DUPLEX ULTRASOUND OF LIVER TECHNIQUE: Color and duplex Doppler ultrasound was performed to evaluate the hepatic in-flow and out-flow vessels. COMPARISON:  None. FINDINGS: Portal Vein Velocities Main:  26 cm/sec Right:  7 cm/sec Left:  8 cm/sec  Hepatic Vein Velocities Right:  22 cm/sec Middle:  12 cm/sec Left:  6 cm/sec Hepatic Artery Velocity:  260 cm/sec Splenic Vein Velocity:  21 cm/sec Varices: None identified. Ascites: Small to moderate volume ascites present. There is no evidence of portal vein thrombus in the main portal vein appears normal in caliber. Portal vein flow is hepatopetal. Although the main portal vein velocity is not significantly reduced, intrahepatic portal vein velocity does appear to be significantly reduced and elevated hepatic artery velocities support underlying portal hypertension. Portal vein waveforms are somewhat pulsatile in appearance at the level of the main portal vein. However, there does appear to be some likely interference with adjacent hepatic arterial transmission. Intrahepatic portal vein waveforms are not pulsatile. Pulsatile portal flow can be associated with right-sided heart failure, tricuspid regurgitation and cirrhosis with arterioportal shunting. No evidence of splenic vein thrombosis. No evidence of hepatic veno-occlusive disease. Venous waveforms are normal. IMPRESSION: Although main portal vein velocities are normal, significant reduction in intrahepatic portal vein velocities and corresponding high velocity in the hepatic artery are suggestive of some degree of underlying portal hypertension. No evidence of portal vein thrombus, reversed portal vein flow or portal vein dilatation. Electronically Signed   By: Irish Lack M.D.   On: 03/18/2017 15:28   US Abdomen Limited Ruq  Result Date: 03/08/2017 CLINICAL DATA:  Elevated bilirubin. EXAM: ULTRASOUND ABDOMEN LIMITED RIGHT UPPER QUADRANT COMPARISON:  CT abdomen 01/16/2017. FINDINGS: Gallbladder: Thick-walled gallbladder, up to 4.6 mm, without visible stones or sludge. Negative sonographic Murphy's sign. No pericholecystic fluid. Common bile duct: Diameter: Upper limits normal, 5.5 mm. Liver: Nodular contour of the liver, with increased echotexture,  suggesting cirrhosis. No focal lesions. Portal vein is patent on color Doppler imaging with normal direction of blood flow towards the liver. Incidental note is made of moderate ascites. IMPRESSION: Findings consistent with cirrhosis of the liver, with probable secondary ascites. No gallstones, but thick-walled gallbladder, almost 5 mm. No biliary ductal dilatation. Electronically Signed   By: Elsie Stain M.D.   On: 03/08/2017 18:48     CBC Recent Labs  Lab 03/17/17 1736 03/17/17 2344  WBC 8.7 8.9  HGB 13.9 13.5  HCT 40.6 38.8*  PLT 221 212  MCV 100.0 99.0  MCH 34.3* 34.4*  MCHC 34.3 34.8  RDW 15.5* 15.6*  LYMPHSABS 3.1 3.7*  MONOABS 1.3* 1.4*  EOSABS 0.1 0.1  BASOSABS 0.0 0.1    Chemistries  Recent Labs  Lab 03/17/17 1736 03/17/17 2344  NA 128*  --   K 5.1  --   CL 96*  --   CO2 22  --   GLUCOSE 155*  --   BUN 16  --   CREATININE 0.83 0.73  CALCIUM 8.7*  --   AST 106*  --   ALT 48  --   ALKPHOS 990*  --   BILITOT 7.9*  --    ------------------------------------------------------------------------------------------------------------------ estimated creatinine clearance is 100.1 mL/min (by C-G formula based on SCr of 0.73 mg/dL). ------------------------------------------------------------------------------------------------------------------ No results for input(s): HGBA1C in the last 72 hours. ------------------------------------------------------------------------------------------------------------------ No results for input(s): CHOL, HDL, LDLCALC, TRIG, CHOLHDL, LDLDIRECT in the last 72 hours. ------------------------------------------------------------------------------------------------------------------ No results for input(s): TSH, T4TOTAL, T3FREE, THYROIDAB in the last 72 hours.  Invalid input(s): FREET3 ------------------------------------------------------------------------------------------------------------------ No results for input(s): VITAMINB12,  FOLATE, FERRITIN, TIBC, IRON, RETICCTPCT in the last 72 hours.  Coagulation profile Recent Labs  Lab 03/17/17 1736  INR 1.10  No results for input(s): DDIMER in the last 72 hours.  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  Hyponatremia related to liver cirrhosis and ascites continue to follow      Code Status Orders  (From admission, onward)        Start     Ordered   03/17/17 2333  Full code  Continuous     03/17/17 2332    Code Status History    Date Active Date Inactive Code Status Order ID Comments User Context   03/08/2017 23:25 03/09/2017 19:45 Full Code [id number]  Oralia ManisWillis, David, MD Inpatient           Consults neurology   DVT Prophylaxis SCDs Lab Results  Component Value Date   PLT 212 03/17/2017     Time Spent in minutes 32 minutes  Greater than 50% of time spent in care coordination and counseling patient regarding the condition and plan of care.   Auburn BilberryPATEL, Braylei Totino M.D on 03/19/2017 at 2:00 PM  Between 7am to 6pm - Pager - [phone]  After 6pm go to [email or web address] - password [email or web address] [facility]  [facility]   Office  [phone]

## 2017-03-20 ENCOUNTER — Inpatient Hospital Stay: Payer: Medicaid Other

## 2017-03-20 ENCOUNTER — Encounter: Payer: Self-pay | Admitting: Radiology

## 2017-03-20 LAB — BASIC METABOLIC PANEL
ANION GAP: 8 (ref 5–15)
BUN: 11 mg/dL (ref 6–20)
CALCIUM: 8.7 mg/dL — AB (ref 8.9–10.3)
CHLORIDE: 100 mmol/L — AB (ref 101–111)
CO2: 23 mmol/L (ref 22–32)
Creatinine, Ser: 0.8 mg/dL (ref 0.61–1.24)
GFR calc non Af Amer: >60 mL/min (ref >60)
Glucose, Bld: 101 mg/dL — ABNORMAL HIGH (ref 65–99)
Potassium: 4.1 mmol/L (ref 3.5–5.1)
Sodium: 131 mmol/L — ABNORMAL LOW (ref 135–145)

## 2017-03-20 MED ORDER — IOPAMIDOL (ISOVUE-370) INJECTION 76%
75.0000 mL | Freq: Once | INTRAVENOUS | Status: AC | PRN
  Administered 2017-03-20: 75 mL via INTRAVENOUS

## 2017-03-20 MED ORDER — IOPAMIDOL (ISOVUE-300) INJECTION 61%
15.0000 mL | INTRAVENOUS | Status: AC
  Administered 2017-03-20 (×2): 15 mL via ORAL

## 2017-03-20 NOTE — Plan of Care (Signed)
Pts abdomen is still distended. Tolerating clear liquid diet

## 2017-03-20 NOTE — Progress Notes (Signed)
Patrick [name] , MD [address], [address], [place], [place], [id number] [address], [place], [place], [address] Phone: [phone]  Fax: [phone]   Patrick [name] is being followed for SBP  Day 2 of follow up   Subjective: No complaints    Objective: Vital signs in last 24 hours: Vitals:   03/19/17 1445 03/19/17 2115 03/20/17 0451 03/20/17 0547  BP: 96/61 102/64 112/65   Pulse: 87 90 95   Resp:   18   Temp:  98.5 F (36.9 C) 98.6 F (37 C)   TempSrc:  Oral Oral   SpO2:  94% 95%   Weight:    174 lb (78.9 kg)  Height:       Weight change: 8 oz (0.227 kg)  Intake/Output Summary (Last 24 hours) at 03/20/2017 1154 Last data filed at 03/20/2017 [id number] Gross per 24 hour  Intake 1070 ml  Output -  Net 1070 ml     Exam: Heart:: Regular rate and rhythm, S1S2 present or without murmur or extra heart sounds Lungs: normal, clear to auscultation and clear to auscultation and percussion Abdomen: soft ,distended, non tender, no organomegaly , fluid +, BS+   Lab Results: @LABTEST2 @ Micro Results: Recent Results (from the past 240 hour(s))  Body fluid culture     Status: None   Collection Time: 03/16/17  9:04 AM  Result Value Ref Range Status   Specimen Description   Final    PERITONEAL Performed at [facility], [address] [place], [place] [address]    Special Requests   Final    NONE Performed at [facility], [address] [place], [place] [address]    Gram Stain   Final    RARE WBC PRESENT,BOTH PMN AND MONONUCLEAR NO ORGANISMS SEEN    Culture   Final    NO GROWTH 3 DAYS Performed at [facility], [address] [place], [place] [address]    Report Status 03/19/2017 FINAL  Final  Aerobic/Anaerobic Culture (surgical/deep wound)     Status: None (Preliminary result)   Collection Time: 03/16/17  9:04 AM  Result Value Ref Range Status   Specimen Description   Final    PERITONEAL Performed at [facility],  [address] [place], [place] [address]    Special Requests   Final    NONE Performed at [facility], [address] [place], [place] [address]    Gram Stain   Final    RARE WBC PRESENT,BOTH PMN AND MONONUCLEAR NO ORGANISMS SEEN    Culture   Final    NO GROWTH 4 DAYS NO ANAEROBES ISOLATED; CULTURE IN PROGRESS FOR 5 DAYS Performed at [facility], [address] [place], [place] [address]    Report Status PENDING  Incomplete  CULTURE, BLOOD (ROUTINE X 2) w Reflex to ID Panel     Status: None (Preliminary result)   Collection Time: 03/17/17 11:43 PM  Result Value Ref Range Status   Specimen Description BLOOD RIGHT ANTECUBITAL  Final   Special Requests   Final    BOTTLES DRAWN AEROBIC AND ANAEROBIC Blood Culture adequate volume   Culture   Final    NO GROWTH 2 DAYS Performed at [facility], [address] [place], [place] [address]    Report Status PENDING  Incomplete  CULTURE, BLOOD (ROUTINE X 2) w Reflex to ID Panel     Status: None (Preliminary result)   Collection Time:  03/17/17 11:44 PM  Result Value Ref Range Status   Specimen Description BLOOD RIGHT ANTECUBITAL  Final   Special Requests   Final    BOTTLES DRAWN AEROBIC AND ANAEROBIC Blood Culture adequate volume   Culture   Final    NO GROWTH 2 DAYS Performed at [facility], [address] [place], [place] [address]    Report Status PENDING  Incomplete  Aerobic/Anaerobic Culture (surgical/deep wound)     Status: None (Preliminary result)   Collection Time: 03/19/17  1:00 PM  Result Value Ref Range Status   Specimen Description   Final    PERITONEAL Performed at [facility], [address] [place], [place] [address]    Special Requests   Final    NONE Performed at [facility], [address] [place], [place] [address]    Gram Stain   Final    FEW WBC PRESENT,BOTH PMN AND MONONUCLEAR NO ORGANISMS SEEN    Culture   Final    NO GROWTH 1  DAY Performed at [facility], [address] [place], [place] [address]    Report Status PENDING  Incomplete   Studies/Results: [place] Paracentesis  Result Date: 03/19/2017 INDICATION: Abdominal distention. Recurrent ascites. Request diagnostic therapeutic paracentesis. EXAM: ULTRASOUND GUIDED RIGHT LOWER QUADRANT PARACENTESIS MEDICATIONS: None. COMPLICATIONS: None immediate. PROCEDURE: Informed written consent was obtained from the patient after a discussion of the risks, benefits and alternatives to treatment. A timeout was performed prior to the initiation of the procedure. Initial ultrasound scanning demonstrates a large amount of ascites within the right lower abdominal quadrant. The right lower abdomen was prepped and draped in the usual sterile fashion. 1% lidocaine with epinephrine was used for local anesthesia. Following this, a Safe-T-Centesis catheter was introduced. An ultrasound image was saved for documentation purposes. The paracentesis was performed. The catheter was removed and a dressing was applied. The patient tolerated the procedure well without immediate post procedural complication. FINDINGS: A total of approximately 3.8 L of clear yellow fluid was removed. Samples were sent to the laboratory as requested by the clinical team. IMPRESSION: Successful ultrasound-guided paracentesis yielding 3.8 liters of peritoneal fluid. Read by: Brayton ElKevin Bruning PA-C Electronically Signed   By: Irish LackGlenn  Yamagata M.D.   On: 03/19/2017 14:32   [place] Abdominal Pelvic Art/vent Flow Doppler  Result Date: 03/18/2017 CLINICAL DATA:  Ascites. EXAM: DUPLEX ULTRASOUND OF LIVER TECHNIQUE: Color and duplex Doppler ultrasound was performed to evaluate the hepatic in-flow and out-flow vessels. COMPARISON:  None. FINDINGS: Portal Vein Velocities Main:  26 cm/sec Right:  7 cm/sec Left:  8 cm/sec Hepatic Vein Velocities Right:  22 cm/sec Middle:  12 cm/sec Left:  6 cm/sec Hepatic Artery Velocity:  260 cm/sec Splenic  Vein Velocity:  21 cm/sec Varices: None identified. Ascites: Small to moderate volume ascites present. There is no evidence of portal vein thrombus in the main portal vein appears normal in caliber. Portal vein flow is hepatopetal. Although the main portal vein velocity is not significantly reduced, intrahepatic portal vein velocity does appear to be significantly reduced and elevated hepatic artery velocities support underlying portal hypertension. Portal vein waveforms are somewhat pulsatile in appearance at the level of the main portal vein. However, there does appear to be some likely interference with adjacent hepatic arterial transmission. Intrahepatic portal vein waveforms are not pulsatile. Pulsatile portal flow can be associated with right-sided heart failure, tricuspid regurgitation and cirrhosis with arterioportal shunting. No evidence of splenic vein thrombosis. No evidence of hepatic  veno-occlusive disease. Venous waveforms are normal. IMPRESSION: Although main portal vein velocities are normal, significant reduction in intrahepatic portal vein velocities and corresponding high velocity in the hepatic artery are suggestive of some degree of underlying portal hypertension. No evidence of portal vein thrombus, reversed portal vein flow or portal vein dilatation. Electronically Signed   By: Irish Lack M.D.   On: 03/18/2017 15:28   Medications: I have reviewed the patient's current medications. Scheduled Meds: . finasteride  5 mg Oral Daily  . furosemide  40 mg Oral Daily  . heparin  5,000 Units Subcutaneous Q8H  . iopamidol  15 mL Oral Q1 Hr x 2  . multivitamin with minerals  1 tablet Oral Daily  . pantoprazole  40 mg Oral Daily  . sodium chloride flush  3 mL Intravenous Q12H  . spironolactone  50 mg Oral Daily  . tamsulosin  0.4 mg Oral QPC breakfast   Continuous Infusions: . sodium chloride    . cefTRIAXone (ROCEPHIN)  IV Stopped (03/19/17 1812)   PRN Meds:.sodium chloride,  acetaminophen **OR** acetaminophen, HYDROcodone-acetaminophen, ondansetron **OR** ondansetron (ZOFRAN) IV, polyethylene glycol, sodium chloride flush   Assessment: Active Problems:   Cirrhosis of liver (HCC)   Patrick [name] a 61 y.o.y/o malewithhistory of alcoholic liver cirrhosis. He has been admitted for spontaneous bacterial peritonitis.No portal vein thrombosis on dopplers 03/19/17. Repeat paracentesis 03/19/17 shows 248 WBC with 44% netruphil count - improving. Dr Allena Katz discussed with me earlier today due to significant abdominal distension will get a CT scan of the abdomen    1.Continue antibiotics for 5 days continue follow cultures. At the end of treatment of 5 days of antibiotics the patient would need to be assessed and if he is doing well the antibiotics can be discontinued however if the patient has fever or pain a repeat paracentesis would be indicated and then subsequently antibiotics would have to be decided based on the result. Watch for renal failure which can be a cause of death in people with spontaneous bacterial peritonitis. Suggest infusion of intravenous albumin  1 g/kg body weight today ,  if he develops renal failure then would need to start on octreotide and midodrine as well at that point of time in addition to albumin. His overall long-term prognosis is poor mortality can be as high as 20-40% and 1 and 2-year mortality rates are between 70-80%. As an outpatient after completing his antibiotics you require lifelong antibiotic prophylaxis with either ciprofloxacin or Bactrim. Marland Kitchen   2.He will need an upper endoscopy as an outpatient to screen for esophageal varices when discharged. Continue abstinence from alcohol.  3. Low salt diet <2 grams of sodium per day   4.  Monitor CMP  5. Ascites- avoid normal saline infusions, limit dietary sodium, can drain ascites further with paracentesis, there is no limit to the qty being drained  , if taking out more  than 5L give 25 grams albumin . Once he finishes antibiotics can gradually increase dose of diuretics. Daily weights     LOS: 3 days   Patrick Mood, MD 03/20/2017, 11:54 AM

## 2017-03-20 NOTE — Progress Notes (Signed)
[employer] - [place] at [facility]                                                                                                                                                                                  Patient Demographics   Patrick [name], is a 61 y.o. male, DOB - Jun 22, 1955, HYQ:[id number]  Admit date - 03/17/2017   Admitting Physician Bertrum SolMontell D Salary, MD  Outpatient Primary MD for the patient is [facility]   LOS - 3  Subjective: Patient complains of abdominal pain.  States unable to sleep at night.  He had 3-1/2 L fluid drawn and now again has distention  Review of Systems:   CONSTITUTIONAL: No documented fever. No fatigue, weakness. No weight gain, no weight loss.  EYES: No blurry or double vision.  ENT: No tinnitus. No postnasal drip. No redness of the oropharynx.  RESPIRATORY: No cough, no wheeze, no hemoptysis. No dyspnea.  CARDIOVASCULAR: No chest pain. No orthopnea. No palpitations. No syncope.  GASTROINTESTINAL: No nausea, no vomiting or diarrhea.  Positive abdominal pain. No melena or hematochezia.  GENITOURINARY: No dysuria or hematuria.  ENDOCRINE: No polyuria or nocturia. No heat or cold intolerance.  HEMATOLOGY: No anemia. No bruising. No bleeding.  INTEGUMENTARY: No rashes. No lesions.  MUSCULOSKELETAL: No arthritis. No swelling. No gout.  NEUROLOGIC: No numbness, tingling, or ataxia. No seizure-type activity.  PSYCHIATRIC: No anxiety. No insomnia. No ADD.    Vitals:   Vitals:   03/19/17 1445 03/19/17 2115 03/20/17 0451 03/20/17 0547  BP: 96/61 102/64 112/65   Pulse: 87 90 95   Resp:   18   Temp:  98.5 F (36.9 C) 98.6 F (37 C)   TempSrc:  Oral Oral   SpO2:  94% 95%   Weight:    174 lb (78.9 kg)  Height:        Wt Readings from Last 3 Encounters:  03/20/17 174 lb (78.9 kg)  03/08/17 176 lb 6.4 oz (80 kg)  03/08/17 164 lb (74.4 kg)     Intake/Output Summary (Last 24 hours) at 03/20/2017 1348 Last  data filed at 03/20/2017 1331 Gross per 24 hour  Intake 1070 ml  Output -  Net 1070 ml    Physical Exam:   GENERAL: Pleasant-appearing in no apparent distress.  HEAD, EYES, EARS, NOSE AND THROAT: Atraumatic, normocephalic. Extraocular muscles are intact. Pupils equal and reactive to light. Sclerae anicteric. No conjunctival injection. No oro-pharyngeal erythema.  NECK: Supple. There is no jugular venous distention. No bruits, no lymphadenopathy, no thyromegaly.  HEART: Regular rate and rhythm,. No murmurs, no rubs, no clicks.  LUNGS: Clear to auscultation bilaterally. No rales  or rhonchi. No wheezes.  ABDOMEN: Soft, flat, nontender, positive distended. Has good bowel sounds. No hepatosplenomegaly appreciated.  EXTREMITIES: No evidence of any cyanosis, clubbing, or peripheral edema.  +2 pedal and radial pulses bilaterally.  NEUROLOGIC: The patient is alert, awake, and oriented x3 with no focal motor or sensory deficits appreciated bilaterally.  SKIN: Moist and warm with no rashes appreciated.  Psych: Not anxious, depressed LN: No inguinal LN enlargement    Antibiotics   Anti-infectives (From admission, onward)   Start     Dose/Rate Route Frequency Ordered Stop   03/17/17 2345  ceFEPIme (MAXIPIME) 2 g in dextrose 5 % 50 mL IVPB  Status:  Discontinued     2 g 100 mL/hr over 30 Minutes Intravenous Every 8 hours 03/17/17 2332 03/17/17 2353   03/17/17 1830  cefTRIAXone (ROCEPHIN) 2 g in dextrose 5 % 50 mL IVPB     2 g 100 mL/hr over 30 Minutes Intravenous Every 24 hours 03/17/17 1816        Medications   Scheduled Meds: . finasteride  5 mg Oral Daily  . furosemide  40 mg Oral Daily  . heparin  5,000 Units Subcutaneous Q8H  . multivitamin with minerals  1 tablet Oral Daily  . pantoprazole  40 mg Oral Daily  . sodium chloride flush  3 mL Intravenous Q12H  . spironolactone  50 mg Oral Daily  . tamsulosin  0.4 mg Oral QPC breakfast   Continuous Infusions: . sodium chloride    .  cefTRIAXone (ROCEPHIN)  IV Stopped (03/19/17 1812)   PRN Meds:.sodium chloride, acetaminophen **OR** acetaminophen, HYDROcodone-acetaminophen, ondansetron **OR** ondansetron (ZOFRAN) IV, polyethylene glycol, sodium chloride flush   Data Review:   Micro Results Recent Results (from the past 240 hour(s))  Body fluid culture     Status: None   Collection Time: 03/16/17  9:04 AM  Result Value Ref Range Status   Specimen Description   Final    PERITONEAL Performed at [facility], [address], [place], [place] [address]    Special Requests   Final    NONE Performed at [facility], [address] [place], [place] [address]    Gram Stain   Final    RARE WBC PRESENT,BOTH PMN AND MONONUCLEAR NO ORGANISMS SEEN    Culture   Final    NO GROWTH 3 DAYS Performed at [facility], [address], [place], [place] [address]    Report Status 03/19/2017 FINAL  Final  Aerobic/Anaerobic Culture (surgical/deep wound)     Status: None (Preliminary result)   Collection Time: 03/16/17  9:04 AM  Result Value Ref Range Status   Specimen Description   Final    PERITONEAL Performed at [facility], [address], [place], [place] [address]    Special Requests   Final    NONE Performed at [facility], [address] [place], [place] [address]    Gram Stain   Final    RARE WBC PRESENT,BOTH PMN AND MONONUCLEAR NO ORGANISMS SEEN    Culture   Final    NO GROWTH 4 DAYS NO ANAEROBES ISOLATED; CULTURE IN PROGRESS FOR 5 DAYS Performed at [facility], [address], [place], [place] [address]    Report Status PENDING  Incomplete  CULTURE, BLOOD (ROUTINE X 2) w Reflex to ID Panel     Status: None (Preliminary result)   Collection Time: 03/17/17 11:43 PM  Result Value Ref Range Status  Specimen Description BLOOD RIGHT ANTECUBITAL  Final   Special Requests   Final    BOTTLES DRAWN AEROBIC AND ANAEROBIC Blood Culture adequate volume   Culture    Final    NO GROWTH 2 DAYS Performed at [facility], [address] [place], [place] [address]    Report Status PENDING  Incomplete  CULTURE, BLOOD (ROUTINE X 2) w Reflex to ID Panel     Status: None (Preliminary result)   Collection Time: 03/17/17 11:44 PM  Result Value Ref Range Status   Specimen Description BLOOD RIGHT ANTECUBITAL  Final   Special Requests   Final    BOTTLES DRAWN AEROBIC AND ANAEROBIC Blood Culture adequate volume   Culture   Final    NO GROWTH 2 DAYS Performed at [facility], [address] [place], [place] [address]    Report Status PENDING  Incomplete  Aerobic/Anaerobic Culture (surgical/deep wound)     Status: None (Preliminary result)   Collection Time: 03/19/17  1:00 PM  Result Value Ref Range Status   Specimen Description   Final    PERITONEAL Performed at [facility], [address] [place], [place] [address]    Special Requests   Final    NONE Performed at [facility], [address] [place], [place] [address]    Gram Stain   Final    FEW WBC PRESENT,BOTH PMN AND MONONUCLEAR NO ORGANISMS SEEN    Culture   Final    NO GROWTH 1 DAY Performed at [facility], [address] [place], [place] [address]    Report Status PENDING  Incomplete    Radiology Reports Mr Liver W Wo Contrast  Result Date: 03/09/2017 CLINICAL DATA:  Abnormal liver function tests. Recently diagnosed with cirrhosis. Recent paracentesis. EXAM: MRI ABDOMEN WITHOUT AND WITH CONTRAST TECHNIQUE: Multiplanar multisequence MR imaging of the abdomen was performed both before and after the administration of intravenous contrast. CONTRAST:  15 cc MultiHance COMPARISON:  03/08/2017 abdominal ultrasound.  01/16/2017 CT FINDINGS: Mild to moderate motion degradation throughout. Lower chest: Normal heart size without pericardial or pleural effusion. Hepatobiliary: Hepatomegaly, 20.0 cm craniocaudal. No significant steatosis. No definite  evidence of cirrhosis. Normal gallbladder, without biliary ductal dilatation. Pancreas:  Normal, without mass or ductal dilatation. Spleen:  Normal in size, without focal abnormality. Adrenals/Urinary Tract: Normal adrenal glands. Normal kidneys, without hydronephrosis. Stomach/Bowel: Normal stomach and abdominal bowel loops. Vascular/Lymphatic: Aortic and branch vessel atherosclerosis. Patent portal and splenic veins. Other: Small volume ascites, relatively similar to 01/16/2017. Anasarca. Musculoskeletal: No acute osseous abnormality. IMPRESSION: 1. Motion degradation. 2. Hepatomegaly. No definite explanation for elevated liver function tests. 3. Small volume ascites, similar. Electronically Signed   By: Jeronimo GreavesKyle  Talbot M.D.   On: 03/09/2017 12:10   [place] Abdomen Limited  Result Date: 03/18/2017 CLINICAL DATA:  Cirrhosis. Ascites. Last paracentesis of 03/16/2017. EXAM: LIMITED ABDOMEN ULTRASOUND FOR ASCITES TECHNIQUE: Limited ultrasound survey for ascites was performed in all four abdominal quadrants. COMPARISON:  03/16/2017 FINDINGS: Moderate ascites distends the abdomen, noted in all 4 quadrants. IMPRESSION: Moderate ascites throughout the abdomen. Electronically Signed   By: Amie Portlandavid  Ormond M.D.   On: 03/18/2017 09:42   [place] Paracentesis  Result Date: 03/19/2017 INDICATION: Abdominal distention. Recurrent ascites. Request diagnostic therapeutic paracentesis. EXAM: ULTRASOUND GUIDED RIGHT LOWER QUADRANT PARACENTESIS MEDICATIONS: None. COMPLICATIONS: None immediate. PROCEDURE: Informed written consent was obtained from the patient after a discussion of the risks, benefits and alternatives to treatment. A timeout was performed prior  to the initiation of the procedure. Initial ultrasound scanning demonstrates a large amount of ascites within the right lower abdominal quadrant. The right lower abdomen was prepped and draped in the usual sterile fashion. 1% lidocaine with epinephrine was used for local anesthesia.  Following this, a Safe-T-Centesis catheter was introduced. An ultrasound image was saved for documentation purposes. The paracentesis was performed. The catheter was removed and a dressing was applied. The patient tolerated the procedure well without immediate post procedural complication. FINDINGS: A total of approximately 3.8 L of clear yellow fluid was removed. Samples were sent to the laboratory as requested by the clinical team. IMPRESSION: Successful ultrasound-guided paracentesis yielding 3.8 liters of peritoneal fluid. Read by: Brayton El PA-C Electronically Signed   By: Irish Lack M.D.   On: 03/19/2017 14:32   US Paracentesis  Result Date: 03/16/2017 INDICATION: Cirrhosis and ascites. EXAM: ULTRASOUND GUIDED PARACENTESIS MEDICATIONS: None. COMPLICATIONS: None immediate. PROCEDURE: Informed written consent was obtained from the patient after a discussion of the risks, benefits and alternatives to treatment. A timeout was performed prior to the initiation of the procedure. Initial ultrasound scanning was performed to localize ascites. The left lower abdomen was prepped and draped in the usual sterile fashion. 1% lidocaine was used for local anesthesia. Following this, a 6 Fr Safe-T-Centesis catheter was introduced. An ultrasound image was saved for documentation purposes. The paracentesis was performed. The catheter was removed and a dressing was applied. The patient tolerated the procedure well without immediate post procedural complication. FINDINGS: A total of approximately 4.2 L of yellow fluid was removed. IMPRESSION: Successful ultrasound-guided paracentesis yielding 4.2 liters of peritoneal fluid. Electronically Signed   By: Irish Lack M.D.   On: 03/16/2017 10:46   US Abdominal Pelvic Art/vent Flow Doppler  Result Date: 03/18/2017 CLINICAL DATA:  Ascites. EXAM: DUPLEX ULTRASOUND OF LIVER TECHNIQUE: Color and duplex Doppler ultrasound was performed to evaluate the hepatic in-flow  and out-flow vessels. COMPARISON:  None. FINDINGS: Portal Vein Velocities Main:  26 cm/sec Right:  7 cm/sec Left:  8 cm/sec Hepatic Vein Velocities Right:  22 cm/sec Middle:  12 cm/sec Left:  6 cm/sec Hepatic Artery Velocity:  260 cm/sec Splenic Vein Velocity:  21 cm/sec Varices: None identified. Ascites: Small to moderate volume ascites present. There is no evidence of portal vein thrombus in the main portal vein appears normal in caliber. Portal vein flow is hepatopetal. Although the main portal vein velocity is not significantly reduced, intrahepatic portal vein velocity does appear to be significantly reduced and elevated hepatic artery velocities support underlying portal hypertension. Portal vein waveforms are somewhat pulsatile in appearance at the level of the main portal vein. However, there does appear to be some likely interference with adjacent hepatic arterial transmission. Intrahepatic portal vein waveforms are not pulsatile. Pulsatile portal flow can be associated with right-sided heart failure, tricuspid regurgitation and cirrhosis with arterioportal shunting. No evidence of splenic vein thrombosis. No evidence of hepatic veno-occlusive disease. Venous waveforms are normal. IMPRESSION: Although main portal vein velocities are normal, significant reduction in intrahepatic portal vein velocities and corresponding high velocity in the hepatic artery are suggestive of some degree of underlying portal hypertension. No evidence of portal vein thrombus, reversed portal vein flow or portal vein dilatation. Electronically Signed   By: Irish Lack M.D.   On: 03/18/2017 15:28   US Abdomen Limited Ruq  Result Date: 03/08/2017 CLINICAL DATA:  Elevated bilirubin. EXAM: ULTRASOUND ABDOMEN LIMITED RIGHT UPPER QUADRANT COMPARISON:  CT abdomen 01/16/2017. FINDINGS: Gallbladder: Thick-walled gallbladder,  up to 4.6 mm, without visible stones or sludge. Negative sonographic Murphy's sign. No pericholecystic  fluid. Common bile duct: Diameter: Upper limits normal, 5.5 mm. Liver: Nodular contour of the liver, with increased echotexture, suggesting cirrhosis. No focal lesions. Portal vein is patent on color Doppler imaging with normal direction of blood flow towards the liver. Incidental note is made of moderate ascites. IMPRESSION: Findings consistent with cirrhosis of the liver, with probable secondary ascites. No gallstones, but thick-walled gallbladder, almost 5 mm. No biliary ductal dilatation. Electronically Signed   By: Elsie Stain M.D.   On: 03/08/2017 18:48     CBC Recent Labs  Lab 03/17/17 1736 03/17/17 2344  WBC 8.7 8.9  HGB 13.9 13.5  HCT 40.6 38.8*  PLT 221 212  MCV 100.0 99.0  MCH 34.3* 34.4*  MCHC 34.3 34.8  RDW 15.5* 15.6*  LYMPHSABS 3.1 3.7*  MONOABS 1.3* 1.4*  EOSABS 0.1 0.1  BASOSABS 0.0 0.1    Chemistries  Recent Labs  Lab 03/17/17 1736 03/17/17 2344 03/20/17 0403  NA 128*  --  131*  K 5.1  --  4.1  CL 96*  --  100*  CO2 22  --  23  GLUCOSE 155*  --  101*  BUN 16  --  11  CREATININE 0.83 0.73 0.80  CALCIUM 8.7*  --  8.7*  AST 106*  --   --   ALT 48  --   --   ALKPHOS 990*  --   --   BILITOT 7.9*  --   --    ------------------------------------------------------------------------------------------------------------------ estimated creatinine clearance is 100.1 mL/min (by C-G formula based on SCr of 0.8 mg/dL). ------------------------------------------------------------------------------------------------------------------ No results for input(s): HGBA1C in the last 72 hours. ------------------------------------------------------------------------------------------------------------------ No results for input(s): CHOL, HDL, LDLCALC, TRIG, CHOLHDL, LDLDIRECT in the last 72 hours. ------------------------------------------------------------------------------------------------------------------ No results for input(s): TSH, T4TOTAL, T3FREE, THYROIDAB in the  last 72 hours.  Invalid input(s): FREET3 ------------------------------------------------------------------------------------------------------------------ No results for input(s): VITAMINB12, FOLATE, FERRITIN, TIBC, IRON, RETICCTPCT in the last 72 hours.  Coagulation profile Recent Labs  Lab 03/17/17 1736  INR 1.10    No results for input(s): DDIMER in the last 72 hours.  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  Hyponatremia related to liver cirrhosis and ascites continue to follow      Code Status Orders  (From admission, onward)        Start     Ordered   03/17/17 2333  Full code  Continuous     03/17/17 2332    Code Status History    Date Active Date Inactive Code Status Order ID Comments User Context   03/08/2017 23:25 03/09/2017 19:45 Full Code [id number]  Oralia Manis, MD Inpatient  Consults neurology   DVT Prophylaxis SCDs Lab Results  Component Value Date   PLT 212 03/17/2017     Time Spent in minutes 32 minutes  Greater than 50% of time spent in care coordination and counseling  patient regarding the condition and plan of care.   Auburn Bilberry M.D on 03/20/2017 at 1:48 PM  Between 7am to 6pm - Pager - [phone]  After 6pm go to [email or web address] - password [email or web address] [facility]  [facility]   Office  [phone]

## 2017-03-21 LAB — AEROBIC/ANAEROBIC CULTURE (SURGICAL/DEEP WOUND): CULTURE: NO GROWTH

## 2017-03-21 LAB — COMPREHENSIVE METABOLIC PANEL
ALBUMIN: 3 g/dL — AB (ref 3.5–5.0)
ALK PHOS: 694 U/L — AB (ref 38–126)
ALT: 33 U/L (ref 17–63)
AST: 72 U/L — AB (ref 15–41)
Anion gap: 7 (ref 5–15)
BILIRUBIN TOTAL: 7.3 mg/dL — AB (ref 0.3–1.2)
BUN: 9 mg/dL (ref 6–20)
CO2: 25 mmol/L (ref 22–32)
Calcium: 8.8 mg/dL — ABNORMAL LOW (ref 8.9–10.3)
Chloride: 97 mmol/L — ABNORMAL LOW (ref 101–111)
Creatinine, Ser: 0.84 mg/dL (ref 0.61–1.24)
GFR calc Af Amer: >60 mL/min (ref >60)
GFR calc non Af Amer: >60 mL/min (ref >60)
GLUCOSE: 98 mg/dL (ref 65–99)
POTASSIUM: 4.3 mmol/L (ref 3.5–5.1)
SODIUM: 129 mmol/L — AB (ref 135–145)
TOTAL PROTEIN: 6.5 g/dL (ref 6.5–8.1)

## 2017-03-21 LAB — AEROBIC/ANAEROBIC CULTURE W GRAM STAIN (SURGICAL/DEEP WOUND)

## 2017-03-21 MED ORDER — MIDODRINE HCL 5 MG PO TABS
5.0000 mg | ORAL_TABLET | Freq: Three times a day (TID) | ORAL | Status: DC
  Administered 2017-03-21 – 2017-03-27 (×18): 5 mg via ORAL
  Filled 2017-03-21 (×19): qty 1

## 2017-03-21 MED ORDER — ALBUMIN HUMAN 5 % IV SOLN
25.0000 g | Freq: Once | INTRAVENOUS | Status: AC
  Administered 2017-03-22: 25 g via INTRAVENOUS
  Filled 2017-03-21 (×3): qty 500

## 2017-03-21 NOTE — Progress Notes (Signed)
[employer] - [place] at [facility]                                                                                                                                                                                  Patient Demographics   Patrick [name], is a 61 y.o. male, DOB - [birth year], ZOX:[id number]  Admit date - 03/17/2017   Admitting Physician Bertrum Sol, MD  Outpatient Primary MD for the patient is Center, [facility]   LOS - 4  Subjective: Pt  Abdominal pain improved  Review of Systems:   CONSTITUTIONAL: No documented fever. No fatigue, weakness. No weight gain, no weight loss.  EYES: No blurry or double vision.  ENT: No tinnitus. No postnasal drip. No redness of the oropharynx.  RESPIRATORY: No cough, no wheeze, no hemoptysis. No dyspnea.  CARDIOVASCULAR: No chest pain. No orthopnea. No palpitations. No syncope.  GASTROINTESTINAL: No nausea, no vomiting or diarrhea.  Positive abdominal pain. No melena or hematochezia.  GENITOURINARY: No dysuria or hematuria.  ENDOCRINE: No polyuria or nocturia. No heat or cold intolerance.  HEMATOLOGY: No anemia. No bruising. No bleeding.  INTEGUMENTARY: No rashes. No lesions.  MUSCULOSKELETAL: No arthritis. No swelling. No gout.  NEUROLOGIC: No numbness, tingling, or ataxia. No seizure-type activity.  PSYCHIATRIC: No anxiety. No insomnia. No ADD.    Vitals:   Vitals:   03/20/17 1650 03/20/17 2032 03/21/17 0538 03/21/17 0937  BP: 92/62 (!) 96/56 95/67 (!) 95/55  Pulse: 93 83 82 81  Resp: 12 20 20    Temp: 97.6 F (36.4 C) 97.6 F (36.4 C) 98 F (36.7 C) 97.8 F (36.6 C)  TempSrc: Oral Oral Oral Oral  SpO2: 94% 95% 95% 97%  Weight:      Height:        Wt Readings from Last 3 Encounters:  03/20/17 174 lb (78.9 kg)  03/08/17 176 lb 6.4 oz (80 kg)  03/08/17 164 lb (74.4 kg)     Intake/Output Summary (Last 24 hours) at 03/21/2017 1248 Last data filed at 03/21/2017 1214 Gross per 24 hour   Intake 1023 ml  Output 0 ml  Net 1023 ml    Physical Exam:   GENERAL: Pleasant-appearing in no apparent distress.  HEAD, EYES, EARS, NOSE AND THROAT: Atraumatic, normocephalic. Extraocular muscles are intact. Pupils equal and reactive to light. Sclerae anicteric. No conjunctival injection. No oro-pharyngeal erythema.  NECK: Supple. There is no jugular venous distention. No bruits, no lymphadenopathy, no thyromegaly.  HEART: Regular rate and rhythm,. No murmurs, no rubs, no clicks.  LUNGS: Clear to auscultation bilaterally. No rales or rhonchi. No wheezes.  ABDOMEN: Soft, flat, nontender, positive distended. Has good  bowel sounds. No hepatosplenomegaly appreciated.  EXTREMITIES: No evidence of any cyanosis, clubbing, or peripheral edema.  +2 pedal and radial pulses bilaterally.  NEUROLOGIC: The patient is alert, awake, and oriented x3 with no focal motor or sensory deficits appreciated bilaterally.  SKIN: Moist and warm with no rashes appreciated.  Psych: Not anxious, depressed LN: No inguinal LN enlargement    Antibiotics   Anti-infectives (From admission, onward)   Start     Dose/Rate Route Frequency Ordered Stop   03/17/17 2345  ceFEPIme (MAXIPIME) 2 g in dextrose 5 % 50 mL IVPB  Status:  Discontinued     2 g 100 mL/hr over 30 Minutes Intravenous Every 8 hours 03/17/17 2332 03/17/17 2353   03/17/17 1830  cefTRIAXone (ROCEPHIN) 2 g in dextrose 5 % 50 mL IVPB     2 g 100 mL/hr over 30 Minutes Intravenous Every 24 hours 03/17/17 1816        Medications   Scheduled Meds: . finasteride  5 mg Oral Daily  . furosemide  40 mg Oral Daily  . heparin  5,000 Units Subcutaneous Q8H  . multivitamin with minerals  1 tablet Oral Daily  . pantoprazole  40 mg Oral Daily  . sodium chloride flush  3 mL Intravenous Q12H  . spironolactone  50 mg Oral Daily  . tamsulosin  0.4 mg Oral QPC breakfast   Continuous Infusions: . sodium chloride    . cefTRIAXone (ROCEPHIN)  IV Stopped (03/20/17  1830)   PRN Meds:.sodium chloride, acetaminophen **OR** acetaminophen, HYDROcodone-acetaminophen, ondansetron **OR** ondansetron (ZOFRAN) IV, polyethylene glycol, sodium chloride flush   Data Review:   Micro Results Recent Results (from the past 240 hour(s))  Body fluid culture     Status: None   Collection Time: 03/16/17  9:04 AM  Result Value Ref Range Status   Specimen Description   Final    PERITONEAL Performed at [facility], [address] [place], [place] [address]    Special Requests   Final    NONE Performed at [facility], [address] [place], [place] [address]    Gram Stain   Final    RARE WBC PRESENT,BOTH PMN AND MONONUCLEAR NO ORGANISMS SEEN    Culture   Final    NO GROWTH 3 DAYS Performed at [facility], [address] [place], [place] [address]    Report Status 03/19/2017 FINAL  Final  Aerobic/Anaerobic Culture (surgical/deep wound)     Status: None   Collection Time: 03/16/17  9:04 AM  Result Value Ref Range Status   Specimen Description   Final    PERITONEAL Performed at [facility], [address] [place], [place] [address]    Special Requests   Final    NONE Performed at [facility], [address] [place], [place] [address]    Gram Stain   Final    RARE WBC PRESENT,BOTH PMN AND MONONUCLEAR NO ORGANISMS SEEN    Culture   Final    No growth aerobically or anaerobically. Performed at [facility], [address] [place], [place] [address]    Report Status 03/21/2017 FINAL  Final  CULTURE, BLOOD (ROUTINE X 2) w Reflex to ID Panel     Status: None (Preliminary result)   Collection Time: 03/17/17 11:43 PM  Result Value Ref Range Status   Specimen Description BLOOD RIGHT ANTECUBITAL  Final   Special Requests   Final    BOTTLES DRAWN AEROBIC AND ANAEROBIC  Blood Culture adequate volume   Culture   Final    NO GROWTH 3 DAYS Performed at [facility], [address]  [place], [place] [address]    Report Status PENDING  Incomplete  CULTURE, BLOOD (ROUTINE X 2) w Reflex to ID Panel     Status: None (Preliminary result)   Collection Time: 03/17/17 11:44 PM  Result Value Ref Range Status   Specimen Description BLOOD RIGHT ANTECUBITAL  Final   Special Requests   Final    BOTTLES DRAWN AEROBIC AND ANAEROBIC Blood Culture adequate volume   Culture   Final    NO GROWTH 3 DAYS Performed at [facility], [address], [place], [place] [address]    Report Status PENDING  Incomplete  Aerobic/Anaerobic Culture (surgical/deep wound)     Status: None (Preliminary result)   Collection Time: 03/19/17  1:00 PM  Result Value Ref Range Status   Specimen Description   Final    PERITONEAL Performed at [facility], [address] [place], [place] [address]    Special Requests   Final    NONE Performed at [facility], [address] [place], [place] [address]    Gram Stain   Final    FEW WBC PRESENT,BOTH PMN AND MONONUCLEAR NO ORGANISMS SEEN    Culture   Final    NO GROWTH 2 DAYS Performed at [facility], [address], [place], [place] [address]    Report Status PENDING  Incomplete    Radiology Reports Ct Abdomen Pelvis W Contrast  Result Date: 03/20/2017 CLINICAL DATA:  No surgery. NKI. Hx of liver cirrhosis. Pt c/o abdominal pain and distention since 12/21. Best images possible due to pt ability to hold breath. EXAM: CT ABDOMEN AND PELVIS WITH CONTRAST TECHNIQUE: Multidetector CT imaging of the abdomen and pelvis was performed using the standard protocol following bolus administration of intravenous contrast. CONTRAST:  75mL ISOVUE-370 IOPAMIDOL (ISOVUE-370) INJECTION 76% COMPARISON:  01/14/2017 FINDINGS: Lower chest: Dependent atelectasis. No acute findings. Heart normal in size. Hepatobiliary: The liver top-normal in size. There is heterogeneous attenuation/enhancement. No discrete mass or focal lesion. There is  decreased attenuation diffusely consistent with fatty infiltration. Gallbladder is unremarkable. No bile duct dilation. Pancreas: Unremarkable. No pancreatic ductal dilatation or surrounding inflammatory changes. Spleen: Normal in size without focal abnormality. Adrenals/Urinary Tract: No adrenal masses. Kidneys are normal size, orientation and position. No renal masses or stones. No hydronephrosis. Normal ureters. Bladder is thick walled. There is a left posterior diverticulum and a superior diverticulum. No discrete bladder mass. No stone. Bladder appearance is stable from the prior CT. Stomach/Bowel: No bowel dilation to suggest obstruction. There is no wall thickening or convincing inflammation. Small hiatal hernia. Stomach otherwise unremarkable. Normal appendix visualized. Vascular/Lymphatic: Atherosclerotic disease is noted throughout a normal caliber abdominal aorta extending into the iliac vessels. There are vascular collaterals in the upper abdomen with a patent paraumbilical vein and collaterals along the lesser curvature of the stomach. These findings are stable from the prior CT. Reproductive: Prostate is enlarged bulging into the bladder base, unchanged from the prior CT. Other: Moderate ascites distends the abdomen increased from the prior CT. Musculoskeletal: No fracture or acute finding. No osteoblastic or osteolytic lesions. IMPRESSION: 1. Moderate ascites distends the abdomen. This has increased when compared to the prior CT, and accounts for abdominal distention. 2. Findings consistent with cirrhosis, with superimposed hepatic steatosis, and portal venous hypertension reflected by venous collaterals and ascites. Liver appearance  is stable from the prior study. 3. Thick walled bladder with diverticulum, along with an enlarged prostate. This is stable from the prior CT and suggests chronic bladder outlet obstruction. 4. Aortic atherosclerosis. Electronically Signed   By: Amie Portland M.D.   On:  03/20/2017 14:01   Mr Liver W Wo Contrast  Result Date: 03/09/2017 CLINICAL DATA:  Abnormal liver function tests. Recently diagnosed with cirrhosis. Recent paracentesis. EXAM: MRI ABDOMEN WITHOUT AND WITH CONTRAST TECHNIQUE: Multiplanar multisequence MR imaging of the abdomen was performed both before and after the administration of intravenous contrast. CONTRAST:  15 cc MultiHance COMPARISON:  03/08/2017 abdominal ultrasound.  01/16/2017 CT FINDINGS: Mild to moderate motion degradation throughout. Lower chest: Normal heart size without pericardial or pleural effusion. Hepatobiliary: Hepatomegaly, 20.0 cm craniocaudal. No significant steatosis. No definite evidence of cirrhosis. Normal gallbladder, without biliary ductal dilatation. Pancreas:  Normal, without mass or ductal dilatation. Spleen:  Normal in size, without focal abnormality. Adrenals/Urinary Tract: Normal adrenal glands. Normal kidneys, without hydronephrosis. Stomach/Bowel: Normal stomach and abdominal bowel loops. Vascular/Lymphatic: Aortic and branch vessel atherosclerosis. Patent portal and splenic veins. Other: Small volume ascites, relatively similar to 01/16/2017. Anasarca. Musculoskeletal: No acute osseous abnormality. IMPRESSION: 1. Motion degradation. 2. Hepatomegaly. No definite explanation for elevated liver function tests. 3. Small volume ascites, similar. Electronically Signed   By: Jeronimo Greaves M.D.   On: 03/09/2017 12:10   US Abdomen Limited  Result Date: 03/18/2017 CLINICAL DATA:  Cirrhosis. Ascites. Last paracentesis of 03/16/2017. EXAM: LIMITED ABDOMEN ULTRASOUND FOR ASCITES TECHNIQUE: Limited ultrasound survey for ascites was performed in all four abdominal quadrants. COMPARISON:  03/16/2017 FINDINGS: Moderate ascites distends the abdomen, noted in all 4 quadrants. IMPRESSION: Moderate ascites throughout the abdomen. Electronically Signed   By: Amie Portland M.D.   On: 03/18/2017 09:42   US Paracentesis  Result Date:  03/19/2017 INDICATION: Abdominal distention. Recurrent ascites. Request diagnostic therapeutic paracentesis. EXAM: ULTRASOUND GUIDED RIGHT LOWER QUADRANT PARACENTESIS MEDICATIONS: None. COMPLICATIONS: None immediate. PROCEDURE: Informed written consent was obtained from the patient after a discussion of the risks, benefits and alternatives to treatment. A timeout was performed prior to the initiation of the procedure. Initial ultrasound scanning demonstrates a large amount of ascites within the right lower abdominal quadrant. The right lower abdomen was prepped and draped in the usual sterile fashion. 1% lidocaine with epinephrine was used for local anesthesia. Following this, a Safe-T-Centesis catheter was introduced. An ultrasound image was saved for documentation purposes. The paracentesis was performed. The catheter was removed and a dressing was applied. The patient tolerated the procedure well without immediate post procedural complication. FINDINGS: A total of approximately 3.8 L of clear yellow fluid was removed. Samples were sent to the laboratory as requested by the clinical team. IMPRESSION: Successful ultrasound-guided paracentesis yielding 3.8 liters of peritoneal fluid. Read by: Brayton El PA-C Electronically Signed   By: Irish Lack M.D.   On: 03/19/2017 14:32   US Paracentesis  Result Date: 03/16/2017 INDICATION: Cirrhosis and ascites. EXAM: ULTRASOUND GUIDED PARACENTESIS MEDICATIONS: None. COMPLICATIONS: None immediate. PROCEDURE: Informed written consent was obtained from the patient after a discussion of the risks, benefits and alternatives to treatment. A timeout was performed prior to the initiation of the procedure. Initial ultrasound scanning was performed to localize ascites. The left lower abdomen was prepped and draped in the usual sterile fashion. 1% lidocaine was used for local anesthesia. Following this, a 6 Fr Safe-T-Centesis catheter was introduced. An ultrasound image was  saved for documentation purposes. The paracentesis was  performed. The catheter was removed and a dressing was applied. The patient tolerated the procedure well without immediate post procedural complication. FINDINGS: A total of approximately 4.2 L of yellow fluid was removed. IMPRESSION: Successful ultrasound-guided paracentesis yielding 4.2 liters of peritoneal fluid. Electronically Signed   By: Irish LackGlenn  Yamagata M.D.   On: 03/16/2017 10:46   [place] Abdominal Pelvic Art/vent Flow Doppler  Result Date: 03/18/2017 CLINICAL DATA:  Ascites. EXAM: DUPLEX ULTRASOUND OF LIVER TECHNIQUE: Color and duplex Doppler ultrasound was performed to evaluate the hepatic in-flow and out-flow vessels. COMPARISON:  None. FINDINGS: Portal Vein Velocities Main:  26 cm/sec Right:  7 cm/sec Left:  8 cm/sec Hepatic Vein Velocities Right:  22 cm/sec Middle:  12 cm/sec Left:  6 cm/sec Hepatic Artery Velocity:  260 cm/sec Splenic Vein Velocity:  21 cm/sec Varices: None identified. Ascites: Small to moderate volume ascites present. There is no evidence of portal vein thrombus in the main portal vein appears normal in caliber. Portal vein flow is hepatopetal. Although the main portal vein velocity is not significantly reduced, intrahepatic portal vein velocity does appear to be significantly reduced and elevated hepatic artery velocities support underlying portal hypertension. Portal vein waveforms are somewhat pulsatile in appearance at the level of the main portal vein. However, there does appear to be some likely interference with adjacent hepatic arterial transmission. Intrahepatic portal vein waveforms are not pulsatile. Pulsatile portal flow can be associated with right-sided heart failure, tricuspid regurgitation and cirrhosis with arterioportal shunting. No evidence of splenic vein thrombosis. No evidence of hepatic veno-occlusive disease. Venous waveforms are normal. IMPRESSION: Although main portal vein velocities are normal,  significant reduction in intrahepatic portal vein velocities and corresponding high velocity in the hepatic artery are suggestive of some degree of underlying portal hypertension. No evidence of portal vein thrombus, reversed portal vein flow or portal vein dilatation. Electronically Signed   By: Irish LackGlenn  Yamagata M.D.   On: 03/18/2017 15:28   [place] Abdomen Limited Ruq  Result Date: 03/08/2017 CLINICAL DATA:  Elevated bilirubin. EXAM: ULTRASOUND ABDOMEN LIMITED RIGHT UPPER QUADRANT COMPARISON:  CT abdomen 01/16/2017. FINDINGS: Gallbladder: Thick-walled gallbladder, up to 4.6 mm, without visible stones or sludge. Negative sonographic Murphy's sign. No pericholecystic fluid. Common bile duct: Diameter: Upper limits normal, 5.5 mm. Liver: Nodular contour of the liver, with increased echotexture, suggesting cirrhosis. No focal lesions. Portal vein is patent on color Doppler imaging with normal direction of blood flow towards the liver. Incidental note is made of moderate ascites. IMPRESSION: Findings consistent with cirrhosis of the liver, with probable secondary ascites. No gallstones, but thick-walled gallbladder, almost 5 mm. No biliary ductal dilatation. Electronically Signed   By: Elsie StainJohn T Curnes M.D.   On: 03/08/2017 18:48     CBC Recent Labs  Lab 03/17/17 1736 03/17/17 2344  WBC 8.7 8.9  HGB 13.9 13.5  HCT 40.6 38.8*  PLT 221 212  MCV 100.0 99.0  MCH 34.3* 34.4*  MCHC 34.3 34.8  RDW 15.5* 15.6*  LYMPHSABS 3.1 3.7*  MONOABS 1.3* 1.4*  EOSABS 0.1 0.1  BASOSABS 0.0 0.1    Chemistries  Recent Labs  Lab 03/17/17 1736 03/17/17 2344 03/20/17 0403 03/21/17 0313  NA 128*  --  131* 129*  K 5.1  --  4.1 4.3  CL 96*  --  100* 97*  CO2 22  --  23 25  GLUCOSE 155*  --  101* 98  BUN 16  --  11 9  CREATININE 0.83 0.73 0.80 0.84  CALCIUM 8.7*  --  8.7* 8.8*  AST 106*  --   --  72*  ALT 48  --   --  33  ALKPHOS 990*  --   --  694*  BILITOT 7.9*  --   --  7.3*    ------------------------------------------------------------------------------------------------------------------ estimated creatinine clearance is 95.4 mL/min (by C-G formula based on SCr of 0.84 mg/dL). ------------------------------------------------------------------------------------------------------------------ No results for input(s): HGBA1C in the last 72 hours. ------------------------------------------------------------------------------------------------------------------ No results for input(s): CHOL, HDL, LDLCALC, TRIG, CHOLHDL, LDLDIRECT in the last 72 hours. ------------------------------------------------------------------------------------------------------------------ No results for input(s): TSH, T4TOTAL, T3FREE, THYROIDAB in the last 72 hours.  Invalid input(s): FREET3 ------------------------------------------------------------------------------------------------------------------ No results for input(s): VITAMINB12, FOLATE, FERRITIN, TIBC, IRON, RETICCTPCT in the last 72 hours.  Coagulation profile Recent Labs  Lab 03/17/17 1736  INR 1.10    No results for input(s): DDIMER in the last 72 hours.  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  Hyponatremia related to liver cirrhosis and ascites continue to follow      Code Status Orders  (From admission, onward)        Start     Ordered   03/17/17 2333  Full code  Continuous     03/17/17 2332    Code Status History    Date Active Date Inactive Code Status Order ID Comments User Context   03/08/2017 23:25 03/09/2017 19:45 Full Code [id number]  Oralia Manis, MD Inpatient           Consults neurology   DVT Prophylaxis SCDs Lab Results  Component Value Date   PLT 212 03/17/2017     Time Spent in minutes 32 minutes  Greater than 50% of time spent in care coordination and counseling patient regarding the condition and plan of care.   Auburn Bilberry M.D on 03/21/2017 at 12:48 PM  Between 7am to 6pm - Pager - [phone]  After 6pm go to [email or web address] - password [email or web address] [facility]  [facility] [place] [facility]   Office  [phone]

## 2017-03-21 NOTE — Progress Notes (Signed)
Patient assisted with walking in hallway, he had intermittent episodes of hunching over stating " I'm just a little weak from ll them needles". Patient asked if he needed to sit down and rest he stated " no I'm fine I told you I'm just weak from all them needles.

## 2017-03-22 LAB — BASIC METABOLIC PANEL
Anion gap: 7 (ref 5–15)
BUN: 9 mg/dL (ref 6–20)
CO2: 24 mmol/L (ref 22–32)
Calcium: 8.5 mg/dL — ABNORMAL LOW (ref 8.9–10.3)
Chloride: 98 mmol/L — ABNORMAL LOW (ref 101–111)
Creatinine, Ser: 0.85 mg/dL (ref 0.61–1.24)
GFR calc Af Amer: >60 mL/min (ref >60)
GLUCOSE: 90 mg/dL (ref 65–99)
POTASSIUM: 3.9 mmol/L (ref 3.5–5.1)
Sodium: 129 mmol/L — ABNORMAL LOW (ref 135–145)

## 2017-03-22 LAB — CYTOLOGY - NON PAP

## 2017-03-22 MED ORDER — FUROSEMIDE 10 MG/ML IJ SOLN
40.0000 mg | Freq: Two times a day (BID) | INTRAMUSCULAR | Status: DC
  Administered 2017-03-22 – 2017-03-27 (×11): 40 mg via INTRAVENOUS
  Filled 2017-03-22 (×11): qty 4

## 2017-03-22 MED ORDER — ENSURE ENLIVE PO LIQD
237.0000 mL | Freq: Three times a day (TID) | ORAL | Status: DC
  Administered 2017-03-23 – 2017-03-27 (×12): 237 mL via ORAL

## 2017-03-22 NOTE — Progress Notes (Signed)
Notified by [name] from ultrasound that the order for paracentesis would be canceled per radiologist Dr. Fredia SorrowYamagata because patient did not have enough fluid to safely perform another paracentesis today. Dr. Luberta MutterKonidena notified of the above mentioned as well, also notified her of patients blood pressure of 99/59 and she gave the verbal to still administer lasix and spironolactone.

## 2017-03-22 NOTE — Progress Notes (Signed)
After patient continued to complain of pain and having no urine output, patient was bladder scan with 750 ml showing, post bladder scan straight cathed patient  per Dr. Luberta MutterKonidena verbal. There was only 300 ml of urine obtained and nothing more would drain. Notified ED charge nurse to see if anyone could perform a QD cath on patient as he has history of BPH. ED charge nurse Hansel FeinsteinAndrea Bryant stated that she would check during huddle and notify the nurse. Patient stated that he felt a little better after after the 300 ml of urine was removed, but he was still feeling really tight, uncomfortable and had a little pain. Night shift nurse given report on situation.

## 2017-03-22 NOTE — Progress Notes (Signed)
[employer] - [place] at [facility]                                                                                                                                                                                  Patient Demographics   Patrick [name], is a 61 y.o. male, DOB - 07-Feb-1956, ZOX:[id number]  Admit date - 03/17/2017   Admitting Physician Bertrum Sol, MD  Outpatient Primary MD for the patient is [facility]   LOS - 5  Subjective: He is having recurrent abdominal swelling.  No fever.  Review of Systems:   CONSTITUTIONAL: No documented fever. No fatigue, weakness. No weight gain, no weight loss.  EYES: No blurry or double vision.  ENT: No tinnitus. No postnasal drip. No redness of the oropharynx.  RESPIRATORY: No cough, no wheeze, no hemoptysis. No dyspnea.  CARDIOVASCULAR: No chest pain. No orthopnea. No palpitations. No syncope.  GASTROINTESTINAL: No nausea, no vomiting or diarrhea.  Positive abdominal pain. No melena or hematochezia.  Has  ascites GENITOURINARY: No dysuria or hematuria.  ENDOCRINE: No polyuria or nocturia. No heat or cold intolerance.  HEMATOLOGY: No anemia. No bruising. No bleeding.  INTEGUMENTARY: No rashes. No lesions.  MUSCULOSKELETAL: No arthritis. No swelling. No gout.  NEUROLOGIC: No numbness, tingling, or ataxia. No seizure-type activity.  PSYCHIATRIC: No anxiety. No insomnia. No ADD.    Vitals:   Vitals:   03/21/17 0937 03/21/17 1448 03/21/17 1929 03/22/17 0530  BP: (!) 95/55 95/62 92/75  107/63  Pulse: 81 85 83 82  Resp:  14 20 18   Temp: 97.8 F (36.6 C) (!) 97.4 F (36.3 C) 98.1 F (36.7 C) 97.9 F (36.6 C)  TempSrc: Oral Oral Oral Oral  SpO2: 97% 96% 98% 96%  Weight:      Height:        Wt Readings from Last 3 Encounters:  03/20/17 78.9 kg (174 lb)  03/08/17 80 kg (176 lb 6.4 oz)  03/08/17 74.4 kg (164 lb)     Intake/Output Summary (Last 24 hours) at 03/22/2017 1008 Last data filed  at 03/21/2017 1341 Gross per 24 hour  Intake 720 ml  Output 0 ml  Net 720 ml    Physical Exam:   GENERAL: Pleasant-appearing in no apparent distress.  HEAD, EYES, EARS, NOSE AND THROAT: Atraumatic, normocephalic. Extraocular muscles are intact. Pupils equal and reactive to light. Sclerae anicteric. No conjunctival injection. No oro-pharyngeal erythema.  NECK: Supple. There is no jugular venous distention. No bruits, no lymphadenopathy, no thyromegaly.  HEART: Regular rate and rhythm,. No murmurs, no rubs, no clicks.  LUNGS: Clear to auscultation bilaterally. No rales or rhonchi. No wheezes.  ABDOMEN:  Soft, flat, nontender, positive distended. Has good bowel sounds. No hepatosplenomegaly appreciated, ascites, fluid thrill present.  EXTREMITIES: No evidence of any cyanosis, clubbing, or peripheral edema.  +2 pedal and radial pulses bilaterally.  NEUROLOGIC: The patient is alert, awake, and oriented x3 with no focal motor or sensory deficits appreciated bilaterally.  SKIN: Moist and warm with no rashes appreciated.  Psych: Not anxious, depressed LN: No inguinal LN enlargement    Antibiotics   Anti-infectives (From admission, onward)   Start     Dose/Rate Route Frequency Ordered Stop   03/17/17 2345  ceFEPIme (MAXIPIME) 2 g in dextrose 5 % 50 mL IVPB  Status:  Discontinued     2 g 100 mL/hr over 30 Minutes Intravenous Every 8 hours 03/17/17 2332 03/17/17 2353   03/17/17 1830  cefTRIAXone (ROCEPHIN) 2 g in dextrose 5 % 50 mL IVPB     2 g 100 mL/hr over 30 Minutes Intravenous Every 24 hours 03/17/17 1816        Medications   Scheduled Meds: . finasteride  5 mg Oral Daily  . furosemide  40 mg Oral Daily  . heparin  5,000 Units Subcutaneous Q8H  . midodrine  5 mg Oral TID WC  . multivitamin with minerals  1 tablet Oral Daily  . pantoprazole  40 mg Oral Daily  . sodium chloride flush  3 mL Intravenous Q12H  . spironolactone  50 mg Oral Daily  . tamsulosin  0.4 mg Oral QPC  breakfast   Continuous Infusions: . sodium chloride    . cefTRIAXone (ROCEPHIN)  IV Stopped (03/21/17 1824)   PRN Meds:.sodium chloride, acetaminophen **OR** acetaminophen, HYDROcodone-acetaminophen, ondansetron **OR** ondansetron (ZOFRAN) IV, polyethylene glycol, sodium chloride flush   Data Review:   Micro Results Recent Results (from the past 240 hour(s))  Body fluid culture     Status: None   Collection Time: 03/16/17  9:04 AM  Result Value Ref Range Status   Specimen Description   Final    PERITONEAL Performed at [facility], [address] [place], [place] [address]    Special Requests   Final    NONE Performed at [facility], [address] [place], [place] [address]    Gram Stain   Final    RARE WBC PRESENT,BOTH PMN AND MONONUCLEAR NO ORGANISMS SEEN    Culture   Final    NO GROWTH 3 DAYS Performed at [facility], [address] [place], [place] [address]    Report Status 03/19/2017 FINAL  Final  Aerobic/Anaerobic Culture (surgical/deep wound)     Status: None   Collection Time: 03/16/17  9:04 AM  Result Value Ref Range Status   Specimen Description   Final    PERITONEAL Performed at [facility], [address] [place], [place] [address]    Special Requests   Final    NONE Performed at [facility], [address] [place], [place] [address]    Gram Stain   Final    RARE WBC PRESENT,BOTH PMN AND MONONUCLEAR NO ORGANISMS SEEN    Culture   Final    No growth aerobically or anaerobically. Performed at [facility], [address] [place], [place] [address]    Report Status 03/21/2017 FINAL  Final  CULTURE, BLOOD (ROUTINE X 2) w Reflex to ID Panel     Status: None (Preliminary result)   Collection Time: 03/17/17 11:43 PM  Result Value Ref Range Status   Specimen Description  BLOOD RIGHT ANTECUBITAL  Final   Special Requests   Final    BOTTLES DRAWN AEROBIC AND ANAEROBIC Blood Culture adequate  volume   Culture   Final    NO GROWTH 4 DAYS Performed at [facility] [address] [place], [place] [address]    Report Status PENDING  Incomplete  CULTURE, BLOOD (ROUTINE X 2) w Reflex to ID Panel     Status: None (Preliminary result)   Collection Time: 03/17/17 11:44 PM  Result Value Ref Range Status   Specimen Description BLOOD RIGHT ANTECUBITAL  Final   Special Requests   Final    BOTTLES DRAWN AEROBIC AND ANAEROBIC Blood Culture adequate volume   Culture   Final    NO GROWTH 4 DAYS Performed at [facility], [address], [place], [place] [address]    Report Status PENDING  Incomplete  Aerobic/Anaerobic Culture (surgical/deep wound)     Status: None (Preliminary result)   Collection Time: 03/19/17  1:00 PM  Result Value Ref Range Status   Specimen Description   Final    PERITONEAL Performed at [facility], [address], [place], [place] [address]    Special Requests   Final    NONE Performed at [facility], [address] [place], [place] [address]    Gram Stain   Final    FEW WBC PRESENT,BOTH PMN AND MONONUCLEAR NO ORGANISMS SEEN    Culture   Final    NO GROWTH 2 DAYS Performed at [facility], [address] [place], [place] [address]    Report Status PENDING  Incomplete    Radiology Reports Ct Abdomen Pelvis W Contrast  Result Date: 03/20/2017 CLINICAL DATA:  No surgery. NKI. Hx of liver cirrhosis. Pt c/o abdominal pain and distention since 12/21. Best images possible due to pt ability to hold breath. EXAM: CT ABDOMEN AND PELVIS WITH CONTRAST TECHNIQUE: Multidetector CT imaging of the abdomen and pelvis was performed using the standard protocol following bolus administration of intravenous contrast. CONTRAST:  75mL ISOVUE-370 IOPAMIDOL (ISOVUE-370) INJECTION 76% COMPARISON:  01/14/2017 FINDINGS: Lower chest: Dependent atelectasis. No acute findings. Heart normal in size. Hepatobiliary: The liver top-normal  in size. There is heterogeneous attenuation/enhancement. No discrete mass or focal lesion. There is decreased attenuation diffusely consistent with fatty infiltration. Gallbladder is unremarkable. No bile duct dilation. Pancreas: Unremarkable. No pancreatic ductal dilatation or surrounding inflammatory changes. Spleen: Normal in size without focal abnormality. Adrenals/Urinary Tract: No adrenal masses. Kidneys are normal size, orientation and position. No renal masses or stones. No hydronephrosis. Normal ureters. Bladder is thick walled. There is a left posterior diverticulum and a superior diverticulum. No discrete bladder mass. No stone. Bladder appearance is stable from the prior CT. Stomach/Bowel: No bowel dilation to suggest obstruction. There is no wall thickening or convincing inflammation. Small hiatal hernia. Stomach otherwise unremarkable. Normal appendix visualized. Vascular/Lymphatic: Atherosclerotic disease is noted throughout a normal caliber abdominal aorta extending into the iliac vessels. There are vascular collaterals in the upper abdomen with a patent paraumbilical vein and collaterals along the lesser curvature of the stomach. These findings are stable from the prior CT. Reproductive: Prostate is enlarged bulging into the bladder base, unchanged from the prior CT. Other: Moderate ascites distends the abdomen increased from the prior CT. Musculoskeletal: No fracture or acute finding. No osteoblastic or osteolytic lesions. IMPRESSION: 1. Moderate ascites distends the abdomen. This has increased when compared to the prior CT, and accounts for abdominal distention. 2.  Findings consistent with cirrhosis, with superimposed hepatic steatosis, and portal venous hypertension reflected by venous collaterals and ascites. Liver appearance is stable from the prior study. 3. Thick walled bladder with diverticulum, along with an enlarged prostate. This is stable from the prior CT and suggests chronic bladder  outlet obstruction. 4. Aortic atherosclerosis. Electronically Signed   By: Amie Portland M.D.   On: 03/20/2017 14:01   Mr Liver W Wo Contrast  Result Date: 03/09/2017 CLINICAL DATA:  Abnormal liver function tests. Recently diagnosed with cirrhosis. Recent paracentesis. EXAM: MRI ABDOMEN WITHOUT AND WITH CONTRAST TECHNIQUE: Multiplanar multisequence MR imaging of the abdomen was performed both before and after the administration of intravenous contrast. CONTRAST:  15 cc MultiHance COMPARISON:  03/08/2017 abdominal ultrasound.  01/16/2017 CT FINDINGS: Mild to moderate motion degradation throughout. Lower chest: Normal heart size without pericardial or pleural effusion. Hepatobiliary: Hepatomegaly, 20.0 cm craniocaudal. No significant steatosis. No definite evidence of cirrhosis. Normal gallbladder, without biliary ductal dilatation. Pancreas:  Normal, without mass or ductal dilatation. Spleen:  Normal in size, without focal abnormality. Adrenals/Urinary Tract: Normal adrenal glands. Normal kidneys, without hydronephrosis. Stomach/Bowel: Normal stomach and abdominal bowel loops. Vascular/Lymphatic: Aortic and branch vessel atherosclerosis. Patent portal and splenic veins. Other: Small volume ascites, relatively similar to 01/16/2017. Anasarca. Musculoskeletal: No acute osseous abnormality. IMPRESSION: 1. Motion degradation. 2. Hepatomegaly. No definite explanation for elevated liver function tests. 3. Small volume ascites, similar. Electronically Signed   By: Jeronimo Greaves M.D.   On: 03/09/2017 12:10   US Abdomen Limited  Result Date: 03/18/2017 CLINICAL DATA:  Cirrhosis. Ascites. Last paracentesis of 03/16/2017. EXAM: LIMITED ABDOMEN ULTRASOUND FOR ASCITES TECHNIQUE: Limited ultrasound survey for ascites was performed in all four abdominal quadrants. COMPARISON:  03/16/2017 FINDINGS: Moderate ascites distends the abdomen, noted in all 4 quadrants. IMPRESSION: Moderate ascites throughout the abdomen.  Electronically Signed   By: Amie Portland M.D.   On: 03/18/2017 09:42   US Paracentesis  Result Date: 03/19/2017 INDICATION: Abdominal distention. Recurrent ascites. Request diagnostic therapeutic paracentesis. EXAM: ULTRASOUND GUIDED RIGHT LOWER QUADRANT PARACENTESIS MEDICATIONS: None. COMPLICATIONS: None immediate. PROCEDURE: Informed written consent was obtained from the patient after a discussion of the risks, benefits and alternatives to treatment. A timeout was performed prior to the initiation of the procedure. Initial ultrasound scanning demonstrates a large amount of ascites within the right lower abdominal quadrant. The right lower abdomen was prepped and draped in the usual sterile fashion. 1% lidocaine with epinephrine was used for local anesthesia. Following this, a Safe-T-Centesis catheter was introduced. An ultrasound image was saved for documentation purposes. The paracentesis was performed. The catheter was removed and a dressing was applied. The patient tolerated the procedure well without immediate post procedural complication. FINDINGS: A total of approximately 3.8 L of clear yellow fluid was removed. Samples were sent to the laboratory as requested by the clinical team. IMPRESSION: Successful ultrasound-guided paracentesis yielding 3.8 liters of peritoneal fluid. Read by: Brayton El PA-C Electronically Signed   By: Irish Lack M.D.   On: 03/19/2017 14:32   US Paracentesis  Result Date: 03/16/2017 INDICATION: Cirrhosis and ascites. EXAM: ULTRASOUND GUIDED PARACENTESIS MEDICATIONS: None. COMPLICATIONS: None immediate. PROCEDURE: Informed written consent was obtained from the patient after a discussion of the risks, benefits and alternatives to treatment. A timeout was performed prior to the initiation of the procedure. Initial ultrasound scanning was performed to localize ascites. The left lower abdomen was prepped and draped in the usual sterile fashion. 1% lidocaine was used for  local anesthesia.  Following this, a 6 Fr Safe-T-Centesis catheter was introduced. An ultrasound image was saved for documentation purposes. The paracentesis was performed. The catheter was removed and a dressing was applied. The patient tolerated the procedure well without immediate post procedural complication. FINDINGS: A total of approximately 4.2 L of yellow fluid was removed. IMPRESSION: Successful ultrasound-guided paracentesis yielding 4.2 liters of peritoneal fluid. Electronically Signed   By: Irish LackGlenn  Yamagata M.D.   On: 03/16/2017 10:46   [place] Abdominal Pelvic Art/vent Flow Doppler  Result Date: 03/18/2017 CLINICAL DATA:  Ascites. EXAM: DUPLEX ULTRASOUND OF LIVER TECHNIQUE: Color and duplex Doppler ultrasound was performed to evaluate the hepatic in-flow and out-flow vessels. COMPARISON:  None. FINDINGS: Portal Vein Velocities Main:  26 cm/sec Right:  7 cm/sec Left:  8 cm/sec Hepatic Vein Velocities Right:  22 cm/sec Middle:  12 cm/sec Left:  6 cm/sec Hepatic Artery Velocity:  260 cm/sec Splenic Vein Velocity:  21 cm/sec Varices: None identified. Ascites: Small to moderate volume ascites present. There is no evidence of portal vein thrombus in the main portal vein appears normal in caliber. Portal vein flow is hepatopetal. Although the main portal vein velocity is not significantly reduced, intrahepatic portal vein velocity does appear to be significantly reduced and elevated hepatic artery velocities support underlying portal hypertension. Portal vein waveforms are somewhat pulsatile in appearance at the level of the main portal vein. However, there does appear to be some likely interference with adjacent hepatic arterial transmission. Intrahepatic portal vein waveforms are not pulsatile. Pulsatile portal flow can be associated with right-sided heart failure, tricuspid regurgitation and cirrhosis with arterioportal shunting. No evidence of splenic vein thrombosis. No evidence of hepatic veno-occlusive  disease. Venous waveforms are normal. IMPRESSION: Although main portal vein velocities are normal, significant reduction in intrahepatic portal vein velocities and corresponding high velocity in the hepatic artery are suggestive of some degree of underlying portal hypertension. No evidence of portal vein thrombus, reversed portal vein flow or portal vein dilatation. Electronically Signed   By: Irish LackGlenn  Yamagata M.D.   On: 03/18/2017 15:28   [place] Abdomen Limited Ruq  Result Date: 03/08/2017 CLINICAL DATA:  Elevated bilirubin. EXAM: ULTRASOUND ABDOMEN LIMITED RIGHT UPPER QUADRANT COMPARISON:  CT abdomen 01/16/2017. FINDINGS: Gallbladder: Thick-walled gallbladder, up to 4.6 mm, without visible stones or sludge. Negative sonographic Murphy's sign. No pericholecystic fluid. Common bile duct: Diameter: Upper limits normal, 5.5 mm. Liver: Nodular contour of the liver, with increased echotexture, suggesting cirrhosis. No focal lesions. Portal vein is patent on color Doppler imaging with normal direction of blood flow towards the liver. Incidental note is made of moderate ascites. IMPRESSION: Findings consistent with cirrhosis of the liver, with probable secondary ascites. No gallstones, but thick-walled gallbladder, almost 5 mm. No biliary ductal dilatation. Electronically Signed   By: Elsie StainJohn T Curnes M.D.   On: 03/08/2017 18:48     CBC Recent Labs  Lab 03/17/17 1736 03/17/17 2344  WBC 8.7 8.9  HGB 13.9 13.5  HCT 40.6 38.8*  PLT 221 212  MCV 100.0 99.0  MCH 34.3* 34.4*  MCHC 34.3 34.8  RDW 15.5* 15.6*  LYMPHSABS 3.1 3.7*  MONOABS 1.3* 1.4*  EOSABS 0.1 0.1  BASOSABS 0.0 0.1    Chemistries  Recent Labs  Lab 03/17/17 1736 03/17/17 2344 03/20/17 0403 03/21/17 0313 03/22/17 0308  NA 128*  --  131* 129* 129*  K 5.1  --  4.1 4.3 3.9  CL 96*  --  100* 97* 98*  CO2 22  --  23 25 24  GLUCOSE 155*  --  101* 98 90  BUN 16  --  11 9 9   CREATININE 0.83 0.73 0.80 0.84 0.85  CALCIUM 8.7*  --  8.7* 8.8*  8.5*  AST 106*  --   --  72*  --   ALT 48  --   --  33  --   ALKPHOS 990*  --   --  694*  --   BILITOT 7.9*  --   --  7.3*  --    ------------------------------------------------------------------------------------------------------------------ estimated creatinine clearance is 94.2 mL/min (by C-G formula based on SCr of 0.85 mg/dL). ------------------------------------------------------------------------------------------------------------------ No results for input(s): HGBA1C in the last 72 hours. ------------------------------------------------------------------------------------------------------------------ No results for input(s): CHOL, HDL, LDLCALC, TRIG, CHOLHDL, LDLDIRECT in the last 72 hours. ------------------------------------------------------------------------------------------------------------------ No results for input(s): TSH, T4TOTAL, T3FREE, THYROIDAB in the last 72 hours.  Invalid input(s): FREET3 ------------------------------------------------------------------------------------------------------------------ No results for input(s): VITAMINB12, FOLATE, FERRITIN, TIBC, IRON, RETICCTPCT in the last 72 hours.  Coagulation profile Recent Labs  Lab 03/17/17 1736  INR 1.10    No results for input(s): DDIMER in the last 72 hours.  Cardiac Enzymes Recent Labs  Lab 03/17/17 1736  TROPONINI 0.03*   ------------------------------------------------------------------------------------------------------------------ Invalid input(s): POCBNP    Assessment & Plan  Patient is a 61 year old with liver cirrhosis with ascites   IMPRESSION AND PLAN: 1  acute SBP On IV Rocephin.  Continue IV Lasix, seen by gastroenterology, continue antibiotics for colitis, follow cultures, no further fevers.  Does have abdominal swelling.  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 3 chronic BPH without obstruction Stable Continue home regiment  4 chronic benign essential hypertension' Hypotension ,continue  midodrine, continue IV Lasix, Aldactone,   5.  Hyponatremia related to liver cirrhosis and ascites continue to follow #6 upper endoscopy as an outpatient for esophageal viruses.     Code Status Orders  (From admission, onward)        Start     Ordered   03/17/17 2333  Full code  Continuous     03/17/17 2332    Code Status History    Date Active Date Inactive Code Status Order ID Comments User Context   03/08/2017 23:25 03/09/2017 19:45 Full Code [id number]  Oralia ManisWillis, David, MD Inpatient           Consults neurology   DVT Prophylaxis SCDs Lab Results  Component Value Date   PLT 212 03/17/2017     Time Spent in minutes 32 minutes  Greater than 50% of time spent in care coordination and counseling patient regarding the condition and plan of care.   Katha HammingSnehalatha Camara Rosander M.D on 03/22/2017 at 10:08 AM  Between 7am to 6pm - Pager - [phone]  After 6pm go to [email or web address] - password [email or web address] [facility]  [facility]   Office  [phone]

## 2017-03-23 DIAGNOSIS — Z1339 Encounter for screening examination for other mental health and behavioral disorders: Secondary | ICD-10-CM

## 2017-03-23 LAB — CULTURE, BLOOD (ROUTINE X 2)
CULTURE: NO GROWTH
CULTURE: NO GROWTH
SPECIAL REQUESTS: ADEQUATE
Special Requests: ADEQUATE

## 2017-03-23 LAB — BASIC METABOLIC PANEL
ANION GAP: 7 (ref 5–15)
BUN: 12 mg/dL (ref 6–20)
CO2: 27 mmol/L (ref 22–32)
CREATININE: 0.81 mg/dL (ref 0.61–1.24)
Calcium: 8.5 mg/dL — ABNORMAL LOW (ref 8.9–10.3)
Chloride: 97 mmol/L — ABNORMAL LOW (ref 101–111)
GFR calc non Af Amer: >60 mL/min (ref >60)
Glucose, Bld: 127 mg/dL — ABNORMAL HIGH (ref 65–99)
Potassium: 4.1 mmol/L (ref 3.5–5.1)
SODIUM: 131 mmol/L — AB (ref 135–145)

## 2017-03-23 LAB — CBC
HCT: 35.8 % — ABNORMAL LOW (ref 40.0–52.0)
HEMOGLOBIN: 12.4 g/dL — AB (ref 13.0–18.0)
MCH: 34.5 pg — ABNORMAL HIGH (ref 26.0–34.0)
MCHC: 34.7 g/dL (ref 32.0–36.0)
MCV: 99.4 fL (ref 80.0–100.0)
Platelets: 201 10*3/uL (ref 150–440)
RBC: 3.6 MIL/uL — ABNORMAL LOW (ref 4.40–5.90)
RDW: 15.6 % — AB (ref 11.5–14.5)
WBC: 7.6 10*3/uL (ref 3.8–10.6)

## 2017-03-23 MED ORDER — LACTULOSE 10 GM/15ML PO SOLN
30.0000 g | Freq: Every day | ORAL | Status: DC
  Administered 2017-03-23 – 2017-03-27 (×5): 30 g via ORAL
  Filled 2017-03-23 (×5): qty 60

## 2017-03-23 NOTE — Clinical Social Work Note (Signed)
CSW spoke to patient's sister Kym she has concerns about patient's living situation.  Patient's sister states patient sometimes forgets to take his meds, and he does not have electric heat.  Patient's sister states she is afraid patient may freeze one day.  Patient's sister reports that she has tried to convince him to move to [place], but patient does not really seem interested.  CSW explained to patient's sister, that she can call APS if she is concerned about his well-being.  CSW also informed her that this CSW can also do an APS report, CSW suggested that psych see patient to determine if he has capacity to make his own decisions.  CSW also informed patient's sister that if psych says he has capacity there is not much CSW can do, because patient would be allowed to make poor decisions.  Patient's sister expressed appreciation for CSW assistance.  Ervin KnackEric R. Lakeeta Dobosz, MSW, Theresia MajorsLCSWA [phone]  03/23/2017 6:07 PM

## 2017-03-23 NOTE — Progress Notes (Signed)
[employer] [facility] [place] [facility]                                                                                                                                                                                  Patient Demographics   Patrick [name], is a 61 y.o. male, DOB - Sep 05, 1955, JXB:[id number]  Admit date - 03/17/2017   Admitting Physician Bertrum Sol, MD  Outpatient Primary MD for the patient is Center, [facility]   LOS - 6  Subjective: Urinary retention, abdominal pain yesterday Foley inserted last night now he feels better, Foley draining clear yellow urine.  Patient had a history of BPH, has been urinating urine times 750 mL document of the bladder scan.  And now he feels better after catheter insertion.  Patient appears to be having memory problems and also some cognitive deficits and according to nurse patient had this problem since admission. Review of Systems:   CONSTITUTIONAL: No documented fever. No fatigue, weakness. No weight gain, no weight loss.  EYES: No blurry or double vision.  ENT: No tinnitus. No postnasal drip. No redness of the oropharynx.  RESPIRATORY: No cough, no wheeze, no hemoptysis. No dyspnea.  CARDIOVASCULAR: No chest pain. No orthopnea. No palpitations. No syncope.  GASTROINTESTINAL: No nausea, no vomiting or diarrhea.  Positive abdominal pain. No melena or hematochezia.  Has  ascites GENITOURINARY: No dysuria or hematuria.  ENDOCRINE: No polyuria or nocturia. No heat or cold intolerance.  HEMATOLOGY: No anemia. No bruising. No bleeding.  INTEGUMENTARY: No rashes. No lesions.  MUSCULOSKELETAL: No arthritis. No swelling. No gout.  NEUROLOGIC: No numbness, tingling, or ataxia. No seizure-type activity.  PSYCHIATRIC: No anxiety. No insomnia. No ADD.    Vitals:   Vitals:   03/23/17 0445 03/23/17 0500 03/23/17 0743 03/23/17 0925  BP: 111/66  101/72 112/75  Pulse: 80  81 80  Resp: 20     Temp: 97.9 F (36.6 C)      TempSrc: Oral     SpO2: 97%     Weight:  79.4 kg (175 lb)    Height:        Wt Readings from Last 3 Encounters:  03/23/17 79.4 kg (175 lb)  03/08/17 80 kg (176 lb 6.4 oz)  03/08/17 74.4 kg (164 lb)     Intake/Output Summary (Last 24 hours) at 03/23/2017 1055 Last data filed at 03/23/2017 0600 Gross per 24 hour  Intake 300 ml  Output 1850 ml  Net -1550 ml    Physical Exam:   GENERAL: Pleasant-appearing in no apparent distress.  HEAD, EYES, EARS, NOSE AND THROAT: Atraumatic, normocephalic. Extraocular muscles are intact. Pupils equal and reactive  to light. Sclerae anicteric. No conjunctival injection. No oro-pharyngeal erythema.  NECK: Supple. There is no jugular venous distention. No bruits, no lymphadenopathy, no thyromegaly.  HEART: Regular rate and rhythm,. No murmurs, no rubs, no clicks.  LUNGS: Clear to auscultation bilaterally. No rales or rhonchi. No wheezes.  ABDOMEN: Soft, flat, nontender, positive distended. Has good bowel sounds. No hepatosplenomegaly appreciated, ascites, fluid thrill present.  Foley  Is draining clear yellow urine. EXTREMITIES: No evidence of any cyanosis, clubbing, or peripheral edema.  +2 pedal and radial pulses bilaterally.  NEUROLOGIC: The patient is alert, awake, and oriented x3 with no focal motor or sensory deficits appreciated bilaterally. Some cognitive deficit. SKIN: Moist and warm with no rashes appreciated.  Psych: Not anxious, depressed LN: No inguinal LN enlargement    Antibiotics   Anti-infectives (From admission, onward)   Start     Dose/Rate Route Frequency Ordered Stop   03/17/17 2345  ceFEPIme (MAXIPIME) 2 g in dextrose 5 % 50 mL IVPB  Status:  Discontinued     2 g 100 mL/hr over 30 Minutes Intravenous Every 8 hours 03/17/17 2332 03/17/17 2353   03/17/17 1830  cefTRIAXone (ROCEPHIN) 2 g in dextrose 5 % 50 mL IVPB     2 g 100 mL/hr over 30 Minutes Intravenous Every 24 hours 03/17/17 1816        Medications   Scheduled  Meds: . feeding supplement (ENSURE ENLIVE)  237 mL Oral TID BM  . finasteride  5 mg Oral Daily  . furosemide  40 mg Intravenous Q12H  . heparin  5,000 Units Subcutaneous Q8H  . midodrine  5 mg Oral TID WC  . multivitamin with minerals  1 tablet Oral Daily  . pantoprazole  40 mg Oral Daily  . sodium chloride flush  3 mL Intravenous Q12H  . spironolactone  50 mg Oral Daily  . tamsulosin  0.4 mg Oral QPC breakfast   Continuous Infusions: . sodium chloride    . cefTRIAXone (ROCEPHIN)  IV 2 g (03/22/17 1805)   PRN Meds:.sodium chloride, acetaminophen **OR** acetaminophen, HYDROcodone-acetaminophen, ondansetron **OR** ondansetron (ZOFRAN) IV, polyethylene glycol, sodium chloride flush   Data Review:   Micro Results Recent Results (from the past 240 hour(s))  Body fluid culture     Status: None   Collection Time: 03/16/17  9:04 AM  Result Value Ref Range Status   Specimen Description   Final    PERITONEAL Performed at [facility], [address] [place], [place] [address]    Special Requests   Final    NONE Performed at [facility], [address] [place], [place] [address]    Gram Stain   Final    RARE WBC PRESENT,BOTH PMN AND MONONUCLEAR NO ORGANISMS SEEN    Culture   Final    NO GROWTH 3 DAYS Performed at [facility], [address] [place], [place] [address]    Report Status 03/19/2017 FINAL  Final  Aerobic/Anaerobic Culture (surgical/deep wound)     Status: None   Collection Time: 03/16/17  9:04 AM  Result Value Ref Range Status   Specimen Description   Final    PERITONEAL Performed at [facility], [address] [place], [place] [address]    Special Requests   Final    NONE Performed at [facility], [address] [place], [place] [address]    Gram Stain   Final    RARE WBC PRESENT,BOTH PMN AND MONONUCLEAR NO ORGANISMS SEEN  Culture   Final    No growth aerobically or anaerobically. Performed at [facility], [address] [place], [place] [address]    Report Status 03/21/2017 FINAL  Final  CULTURE, BLOOD (ROUTINE X 2) w Reflex to ID Panel     Status: None   Collection Time: 03/17/17 11:43 PM  Result Value Ref Range Status   Specimen Description BLOOD RIGHT ANTECUBITAL  Final   Special Requests   Final    BOTTLES DRAWN AEROBIC AND ANAEROBIC Blood Culture adequate volume   Culture   Final    NO GROWTH 5 DAYS Performed at [facility], [address] [place], [place] [address]    Report Status 03/23/2017 FINAL  Final  CULTURE, BLOOD (ROUTINE X 2) w Reflex to ID Panel     Status: None   Collection Time: 03/17/17 11:44 PM  Result Value Ref Range Status   Specimen Description BLOOD RIGHT ANTECUBITAL  Final   Special Requests   Final    BOTTLES DRAWN AEROBIC AND ANAEROBIC Blood Culture adequate volume   Culture   Final    NO GROWTH 5 DAYS Performed at [facility], [address], [place], [place] [address]    Report Status 03/23/2017 FINAL  Final  Aerobic/Anaerobic Culture (surgical/deep wound)     Status: None (Preliminary result)   Collection Time: 03/19/17  1:00 PM  Result Value Ref Range Status   Specimen Description   Final    PERITONEAL Performed at [facility], [address], [place], [place] [address]    Special Requests   Final    NONE Performed at [facility], [address] [place], [place] [address]    Gram Stain   Final    FEW WBC PRESENT,BOTH PMN AND MONONUCLEAR NO ORGANISMS SEEN    Culture   Final    NO GROWTH 3 DAYS Performed at [facility], [address], [place], [place] [address]    Report Status PENDING  Incomplete    Radiology Reports Ct Abdomen Pelvis W Contrast  Result Date: 03/20/2017 CLINICAL DATA:  No surgery. NKI. Hx of liver cirrhosis. Pt c/o abdominal pain and distention since 12/21. Best images possible due to pt ability to hold breath. EXAM: CT ABDOMEN AND PELVIS WITH CONTRAST  TECHNIQUE: Multidetector CT imaging of the abdomen and pelvis was performed using the standard protocol following bolus administration of intravenous contrast. CONTRAST:  75mL ISOVUE-370 IOPAMIDOL (ISOVUE-370) INJECTION 76% COMPARISON:  01/14/2017 FINDINGS: Lower chest: Dependent atelectasis. No acute findings. Heart normal in size. Hepatobiliary: The liver top-normal in size. There is heterogeneous attenuation/enhancement. No discrete mass or focal lesion. There is decreased attenuation diffusely consistent with fatty infiltration. Gallbladder is unremarkable. No bile duct dilation. Pancreas: Unremarkable. No pancreatic ductal dilatation or surrounding inflammatory changes. Spleen: Normal in size without focal abnormality. Adrenals/Urinary Tract: No adrenal masses. Kidneys are normal size, orientation and position. No renal masses or stones. No hydronephrosis. Normal ureters. Bladder is thick walled. There is a left posterior diverticulum and a superior diverticulum. No discrete bladder mass. No stone. Bladder appearance is stable from the prior CT. Stomach/Bowel: No bowel dilation to suggest obstruction. There is no wall thickening or convincing inflammation. Small hiatal hernia. Stomach otherwise unremarkable. Normal appendix visualized. Vascular/Lymphatic: Atherosclerotic disease is noted throughout a normal caliber abdominal aorta extending into the iliac vessels. There are vascular collaterals in the upper abdomen with a patent paraumbilical vein and collaterals along the lesser curvature of  the stomach. These findings are stable from the prior CT. Reproductive: Prostate is enlarged bulging into the bladder base, unchanged from the prior CT. Other: Moderate ascites distends the abdomen increased from the prior CT. Musculoskeletal: No fracture or acute finding. No osteoblastic or osteolytic lesions. IMPRESSION: 1. Moderate ascites distends the abdomen. This has increased when compared to the prior CT, and  accounts for abdominal distention. 2. Findings consistent with cirrhosis, with superimposed hepatic steatosis, and portal venous hypertension reflected by venous collaterals and ascites. Liver appearance is stable from the prior study. 3. Thick walled bladder with diverticulum, along with an enlarged prostate. This is stable from the prior CT and suggests chronic bladder outlet obstruction. 4. Aortic atherosclerosis. Electronically Signed   By: Amie Portland M.D.   On: 03/20/2017 14:01   Mr Liver W Wo Contrast  Result Date: 03/09/2017 CLINICAL DATA:  Abnormal liver function tests. Recently diagnosed with cirrhosis. Recent paracentesis. EXAM: MRI ABDOMEN WITHOUT AND WITH CONTRAST TECHNIQUE: Multiplanar multisequence MR imaging of the abdomen was performed both before and after the administration of intravenous contrast. CONTRAST:  15 cc MultiHance COMPARISON:  03/08/2017 abdominal ultrasound.  01/16/2017 CT FINDINGS: Mild to moderate motion degradation throughout. Lower chest: Normal heart size without pericardial or pleural effusion. Hepatobiliary: Hepatomegaly, 20.0 cm craniocaudal. No significant steatosis. No definite evidence of cirrhosis. Normal gallbladder, without biliary ductal dilatation. Pancreas:  Normal, without mass or ductal dilatation. Spleen:  Normal in size, without focal abnormality. Adrenals/Urinary Tract: Normal adrenal glands. Normal kidneys, without hydronephrosis. Stomach/Bowel: Normal stomach and abdominal bowel loops. Vascular/Lymphatic: Aortic and branch vessel atherosclerosis. Patent portal and splenic veins. Other: Small volume ascites, relatively similar to 01/16/2017. Anasarca. Musculoskeletal: No acute osseous abnormality. IMPRESSION: 1. Motion degradation. 2. Hepatomegaly. No definite explanation for elevated liver function tests. 3. Small volume ascites, similar. Electronically Signed   By: Jeronimo Greaves M.D.   On: 03/09/2017 12:10   US Abdomen Limited  Result Date:  03/18/2017 CLINICAL DATA:  Cirrhosis. Ascites. Last paracentesis of 03/16/2017. EXAM: LIMITED ABDOMEN ULTRASOUND FOR ASCITES TECHNIQUE: Limited ultrasound survey for ascites was performed in all four abdominal quadrants. COMPARISON:  03/16/2017 FINDINGS: Moderate ascites distends the abdomen, noted in all 4 quadrants. IMPRESSION: Moderate ascites throughout the abdomen. Electronically Signed   By: Amie Portland M.D.   On: 03/18/2017 09:42   US Paracentesis  Result Date: 03/19/2017 INDICATION: Abdominal distention. Recurrent ascites. Request diagnostic therapeutic paracentesis. EXAM: ULTRASOUND GUIDED RIGHT LOWER QUADRANT PARACENTESIS MEDICATIONS: None. COMPLICATIONS: None immediate. PROCEDURE: Informed written consent was obtained from the patient after a discussion of the risks, benefits and alternatives to treatment. A timeout was performed prior to the initiation of the procedure. Initial ultrasound scanning demonstrates a large amount of ascites within the right lower abdominal quadrant. The right lower abdomen was prepped and draped in the usual sterile fashion. 1% lidocaine with epinephrine was used for local anesthesia. Following this, a Safe-T-Centesis catheter was introduced. An ultrasound image was saved for documentation purposes. The paracentesis was performed. The catheter was removed and a dressing was applied. The patient tolerated the procedure well without immediate post procedural complication. FINDINGS: A total of approximately 3.8 L of clear yellow fluid was removed. Samples were sent to the laboratory as requested by the clinical team. IMPRESSION: Successful ultrasound-guided paracentesis yielding 3.8 liters of peritoneal fluid. Read by: Brayton El PA-C Electronically Signed   By: Irish Lack M.D.   On: 03/19/2017 14:32   US Paracentesis  Result Date: 03/16/2017 INDICATION: Cirrhosis and ascites. EXAM:  ULTRASOUND GUIDED PARACENTESIS MEDICATIONS: None. COMPLICATIONS: None  immediate. PROCEDURE: Informed written consent was obtained from the patient after a discussion of the risks, benefits and alternatives to treatment. A timeout was performed prior to the initiation of the procedure. Initial ultrasound scanning was performed to localize ascites. The left lower abdomen was prepped and draped in the usual sterile fashion. 1% lidocaine was used for local anesthesia. Following this, a 6 Fr Safe-T-Centesis catheter was introduced. An ultrasound image was saved for documentation purposes. The paracentesis was performed. The catheter was removed and a dressing was applied. The patient tolerated the procedure well without immediate post procedural complication. FINDINGS: A total of approximately 4.2 L of yellow fluid was removed. IMPRESSION: Successful ultrasound-guided paracentesis yielding 4.2 liters of peritoneal fluid. Electronically Signed   By: Irish Lack M.D.   On: 03/16/2017 10:46   US Abdominal Pelvic Art/vent Flow Doppler  Result Date: 03/18/2017 CLINICAL DATA:  Ascites. EXAM: DUPLEX ULTRASOUND OF LIVER TECHNIQUE: Color and duplex Doppler ultrasound was performed to evaluate the hepatic in-flow and out-flow vessels. COMPARISON:  None. FINDINGS: Portal Vein Velocities Main:  26 cm/sec Right:  7 cm/sec Left:  8 cm/sec Hepatic Vein Velocities Right:  22 cm/sec Middle:  12 cm/sec Left:  6 cm/sec Hepatic Artery Velocity:  260 cm/sec Splenic Vein Velocity:  21 cm/sec Varices: None identified. Ascites: Small to moderate volume ascites present. There is no evidence of portal vein thrombus in the main portal vein appears normal in caliber. Portal vein flow is hepatopetal. Although the main portal vein velocity is not significantly reduced, intrahepatic portal vein velocity does appear to be significantly reduced and elevated hepatic artery velocities support underlying portal hypertension. Portal vein waveforms are somewhat pulsatile in appearance at the level of the main portal  vein. However, there does appear to be some likely interference with adjacent hepatic arterial transmission. Intrahepatic portal vein waveforms are not pulsatile. Pulsatile portal flow can be associated with right-sided heart failure, tricuspid regurgitation and cirrhosis with arterioportal shunting. No evidence of splenic vein thrombosis. No evidence of hepatic veno-occlusive disease. Venous waveforms are normal. IMPRESSION: Although main portal vein velocities are normal, significant reduction in intrahepatic portal vein velocities and corresponding high velocity in the hepatic artery are suggestive of some degree of underlying portal hypertension. No evidence of portal vein thrombus, reversed portal vein flow or portal vein dilatation. Electronically Signed   By: Irish Lack M.D.   On: 03/18/2017 15:28   US Abdomen Limited Ruq  Result Date: 03/08/2017 CLINICAL DATA:  Elevated bilirubin. EXAM: ULTRASOUND ABDOMEN LIMITED RIGHT UPPER QUADRANT COMPARISON:  CT abdomen 01/16/2017. FINDINGS: Gallbladder: Thick-walled gallbladder, up to 4.6 mm, without visible stones or sludge. Negative sonographic Murphy's sign. No pericholecystic fluid. Common bile duct: Diameter: Upper limits normal, 5.5 mm. Liver: Nodular contour of the liver, with increased echotexture, suggesting cirrhosis. No focal lesions. Portal vein is patent on color Doppler imaging with normal direction of blood flow towards the liver. Incidental note is made of moderate ascites. IMPRESSION: Findings consistent with cirrhosis of the liver, with probable secondary ascites. No gallstones, but thick-walled gallbladder, almost 5 mm. No biliary ductal dilatation. Electronically Signed   By: Elsie Stain M.D.   On: 03/08/2017 18:48     CBC Recent Labs  Lab 03/17/17 1736 03/17/17 2344 03/23/17 0348  WBC 8.7 8.9 7.6  HGB 13.9 13.5 12.4*  HCT 40.6 38.8* 35.8*  PLT 221 212 201  MCV 100.0 99.0 99.4  MCH 34.3* 34.4* 34.5*  MCHC 34.3  34.8 34.7   RDW 15.5* 15.6* 15.6*  LYMPHSABS 3.1 3.7*  --   MONOABS 1.3* 1.4*  --   EOSABS 0.1 0.1  --   BASOSABS 0.0 0.1  --     Chemistries  Recent Labs  Lab 03/17/17 1736 03/17/17 2344 03/20/17 0403 03/21/17 0313 03/22/17 0308  NA 128*  --  131* 129* 129*  K 5.1  --  4.1 4.3 3.9  CL 96*  --  100* 97* 98*  CO2 22  --  23 25 24   GLUCOSE 155*  --  101* 98 90  BUN 16  --  11 9 9   CREATININE 0.83 0.73 0.80 0.84 0.85  CALCIUM 8.7*  --  8.7* 8.8* 8.5*  AST 106*  --   --  72*  --   ALT 48  --   --  33  --   ALKPHOS 990*  --   --  694*  --   BILITOT 7.9*  --   --  7.3*  --    ------------------------------------------------------------------------------------------------------------------ estimated creatinine clearance is 94.2 mL/min (by C-G formula based on SCr of 0.85 mg/dL). ------------------------------------------------------------------------------------------------------------------ No results for input(s): HGBA1C in the last 72 hours. ------------------------------------------------------------------------------------------------------------------ No results for input(s): CHOL, HDL, LDLCALC, TRIG, CHOLHDL, LDLDIRECT in the last 72 hours. ------------------------------------------------------------------------------------------------------------------ No results for input(s): TSH, T4TOTAL, T3FREE, THYROIDAB in the last 72 hours.  Invalid input(s): FREET3 ------------------------------------------------------------------------------------------------------------------ No results for input(s): VITAMINB12, FOLATE, FERRITIN, TIBC, IRON, RETICCTPCT in the last 72 hours.  Coagulation profile Recent Labs  Lab 03/17/17 1736  INR 1.10    No results for input(s): DDIMER in the last 72 hours.  Cardiac Enzymes Recent Labs  Lab 03/17/17 1736  TROPONINI 0.03*   ------------------------------------------------------------------------------------------------------------------ Invalid  input(s): POCBNP    Assessment & Plan  Patient is a 61 year old with liver cirrhosis with ascites   IMPRESSION AND PLAN: 1  acute SBP On IV Rocephin.  Continue IV Lasix, seen by gastroenterology, continue antibiotics for colitis, follow cultures, no further fevers.    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 Patient has had extensive workup for his liver cirrhosis in the past Watch for renal function, use IV albumin if needed.   3 ch chronic BPH, urinary retention now.  Foley catheter is inserted.   4 chronic benign essential hypertension';   Hypotension ,continue  midodrine, continue IV Lasix, Aldactone,   5.  Hyponatremia related to liver cirrhosis and ascites continue to follow   #6 upper endoscopy as an outpatient for esophageal viruses.  #6 constipation: Continue stool softeners. Likely discharge by Saturday with p.o. Lasix.     Code Status Orders  (From admission, onward)        Start     Ordered   03/17/17 2333  Full code  Continuous     03/17/17 2332    Code Status History    Date Active Date Inactive Code Status Order ID Comments User Context   03/08/2017 23:25 03/09/2017 19:45 Full Code [id number]  Oralia ManisWillis, David, MD Inpatient           Consults neurology   DVT Prophylaxis SCDs Lab Results  Component Value Date   PLT 201 03/23/2017     Time Spent in minutes 32 minutes  Greater than 50% of time spent in care coordination and counseling patient regarding the condition and plan of care.   Katha HammingSnehalatha Anikin Prosser M.D on 03/23/2017 at 10:55 AM  Between 7am to 6pm - Pager -  [phone]  After 6pm go to [email or web address] - password [email or web address] [facility]   Office  [phone]

## 2017-03-23 NOTE — Care Management Note (Signed)
Case Management Note  Patient Details  Name: Patrick [name] MRN: [id number] Date of Birth: 10/16/1955  Subjective/Objective:                 Spoke with patient's sister Patrick [name].  She has concerns regarding patient's competency and capacity.  He lives alone.  Patient says he takes his medications but Patrick [name] has found several months worth of patient's fluid pills that are untouched.  House only has a wood stove for heat and if patient does not feel well, he just gets in the bed and covers up.  Patient has a significant hearing impediment but he hides it and does not tell physicians or care team members that he has not heard what he has been told. Patrick [name] says that she find patient very confused on numerous occasions.  Patient's brother has offered having patient come and stay in his three bedroom home with heat and meals but "afraid he will say no."  Patrick [name] said if patient would agree, she could stay with patient during the day while her brother worked and brother would stay the night with patient when he got home. Discussed the benefit of home health nurse and social work follow up at discharge.  She is not sure patient will accept the help. Discussed if patient is found to have capacity and competent, no one could force him to change his life style but could make an APS referral. Also discussed patient's mental / cognitive status could also change as his liver test levels change.  Action/Plan: Reached out to attending regarding psych consult for capacity/competency.  Expected Discharge Date:                  Expected Discharge Plan:     In-House Referral:     Discharge planning Services     Post Acute Care Choice:    Choice offered to:     DME Arranged:    DME Agency:     HH Arranged:    HH Agency:     Status of Service:     If discussed at [employer] Meetings, dates discussed:    Additional Comments:  Patrick [name], Patrick Dimperio R, RN 03/23/2017, 4:10 PM

## 2017-03-23 NOTE — Consult Note (Signed)
[facility] Face-to-Face Psychiatry Consult   Reason for Consult: Consult for 61 year old man with a history of alcohol abuse.  Concern raised by the family about capacity Referring Physician:  Vianne Bulls Patient Identification: Patrick [name] MRN:  [id number] Principal Diagnosis: Cirrhosis of liver [facility]) Diagnosis:   Patient Active Problem List   Diagnosis Date Noted  . Cirrhosis of liver ([place]) [K74.60] 03/17/2017  . Alcoholic cirrhosis of liver with ascites ([place]) [K70.31] 03/08/2017  . Hyperbilirubinemia [E80.6] 03/08/2017  . HTN (hypertension) [I10] 03/08/2017  . BPH with obstruction/lower urinary tract symptoms [N40.1, N13.8] 07/22/2015  . Urinary frequency [R35.0] 07/22/2015  . Phimosis [N47.1] 07/22/2015    Total Time spent with patient: 1 hour  Subjective:   Patrick [name] is a 61 y.o. male patient admitted with "it is cirrhosis of the liver".  HPI: Patient interviewed chart reviewed.  Spoke with case management as well.  61 year old man in the hospital with painful ascites.  Family has expressed concern that the patient is not capable of making reasonable decisions for himself.  Apparently they have expressed a wish to have him live with other members of the family or vice versa and he has declined their offers.  Patient was awake and alert and cooperative with the interview.  He was oriented to the facility and the county.  Oriented to the correct year and month.  Patient was able to describe his condition.  He was aware that he had fluid buildup in his abdomen and that he had been told it was the result of cirrhosis of the liver.  He was aware that it was the result of his alcohol abuse.  Patient had a chief complaint of pain in his abdomen.  Very much wanted to have drainage of his ascites.  He said his mood gets bad depressed and grumpy when he is in pain but he does not have any intention or plan to hurt himself.  Has trouble sleeping when he is in pain but otherwise no complaints.  Denies  any hallucinations.  Patient is aware that he is prescribed medication.  He says most of what he had been prescribed at home he did not think was very helpful with him but that he tries to be compliant.  Patient admits that he has continued to drink intermittently although he claims he only drinks 1 or 2 drinks every now and then.  He says he plans to stop entirely now that he understands the connection between drinking and his abdominal pain.  We discussed his home situation.  [place] and that he has no heat and no running water although he claims he does have electricity.  He claims that he finds the situation is acceptable to him so far.  Social history: Not married.  He has some family that are apparently trying to be supportive.  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  He does have electricity he says.  He gets disability.  Medical history: History of high blood pressure history of alcoholic cirrhosis and liver problems.  Substance abuse history: Long-standing problems with alcohol abuse.  Denies other drug abuse.  Past Psychiatric History: Patient told me that he had actually been admitted to [facility] once previously for depression.  He denied trying to kill himself in the past.  He says however he has been treated for depression.  No history of  psychotic disorder.  Risk to Self: Is patient at risk for suicide?: No Risk to Others:   Prior Inpatient Therapy:   Prior Outpatient Therapy:    Past Medical History:  Past Medical History:  Diagnosis Date  . Aortic atherosclerosis ([place]) 12/2016  . Arthritis   . BPH (benign prostatic hyperplasia)   . Cirrhosis of liver ([place])   . Depression   . HTN (hypertension)   . Metatarsal fracture    2nd toe of left foot  . Tuberculosis   . Urinary frequency     Past Surgical History:  Procedure Laterality Date  . NO PAST SURGERIES    .  None     Family History:  Family History  Problem Relation Age of Onset  . Hematuria Unknown   . Tuberculosis Unknown   . Diabetes Mother   . Hypertension Mother   . Kidney disease Neg Hx   . Prostate cancer Neg Hx    Family Psychiatric  History: Tells me he has brothers who also had alcohol problems and had cirrhosis Social History:  Social History   Substance and Sexual Activity  Alcohol Use Yes  . Alcohol/week: 0.0 oz   Comment: daily use up until October 2018     Social History   Substance and Sexual Activity  Drug Use No    Social History   Socioeconomic History  . Marital status: Single    Spouse name: None  . Number of children: None  . Years of education: None  . Highest education level: None  Social Needs  . Financial resource strain: None  . Food insecurity - worry: None  . Food insecurity - inability: None  . Transportation needs - medical: None  . Transportation needs - non-medical: None  Occupational History  . None  Tobacco Use  . Smoking status: Current Every Day Smoker    Packs/day: 0.50  . Smokeless tobacco: Never Used  Substance and Sexual Activity  . Alcohol use: Yes    Alcohol/week: 0.0 oz    Comment: daily use up until October 2018  . Drug use: No  . Sexual activity: None  Other Topics Concern  . None  Social History Narrative  . None   Additional Social History:    Allergies:  No Known Allergies  Labs:  Results for orders placed or performed during the hospital encounter of 03/17/17 (from the past 48 hour(s))  Basic metabolic panel     Status: Abnormal   Collection Time: 03/22/17  3:08 AM  Result Value Ref Range   Sodium 129 (L) 135 - 145 mmol/L   Potassium 3.9 3.5 - 5.1 mmol/L   Chloride 98 (L) 101 - 111 mmol/L   CO2 24 22 - 32 mmol/L   Glucose, Bld 90 65 - 99 mg/dL   BUN 9 6 - 20 mg/dL   Creatinine, Ser 0.85 0.61 - 1.24 mg/dL   Calcium 8.5 (L) 8.9 - 10.3 mg/dL   GFR calc non Af Amer >60 >60 mL/min   GFR calc Af Amer  >60 >60 mL/min    Comment: (NOTE) The eGFR has been calculated using the CKD EPI equation. This calculation has not been validated in all clinical situations. eGFR's persistently <60 mL/min signify possible Chronic Kidney Disease.    Anion gap 7 5 - 15    Comment: Performed at [facility], [address] [place], [place] [address]  CBC     Status: Abnormal   Collection Time: 03/23/17  3:48 AM  Result Value Ref Range   WBC 7.6 3.8 - 10.6 K/uL   RBC 3.60 (L) 4.40 - 5.90 MIL/uL   Hemoglobin 12.4 (L) 13.0 - 18.0 g/dL   HCT 35.8 (L) 40.0 - 52.0 %   MCV 99.4 80.0 - 100.0 fL   MCH 34.5 (H) 26.0 - 34.0 pg   MCHC 34.7 32.0 - 36.0 g/dL   RDW 15.6 (H) 11.5 - 14.5 %   Platelets 201 150 - 440 K/uL    Comment: Performed at [facility], [address] [place], [place] [address]  Basic metabolic panel     Status: Abnormal   Collection Time: 03/23/17 11:29 AM  Result Value Ref Range   Sodium 131 (L) 135 - 145 mmol/L   Potassium 4.1 3.5 - 5.1 mmol/L   Chloride 97 (L) 101 - 111 mmol/L   CO2 27 22 - 32 mmol/L   Glucose, Bld 127 (H) 65 - 99 mg/dL   BUN 12 6 - 20 mg/dL   Creatinine, Ser 0.81 0.61 - 1.24 mg/dL   Calcium 8.5 (L) 8.9 - 10.3 mg/dL   GFR calc non Af Amer >60 >60 mL/min   GFR calc Af Amer >60 >60 mL/min    Comment: (NOTE) The eGFR has been calculated using the CKD EPI equation. This calculation has not been validated in all clinical situations. eGFR's persistently <60 mL/min signify possible Chronic Kidney Disease.    Anion gap 7 5 - 15    Comment: Performed at [facility], [address] [place], [place] [address]    Current Facility-Administered Medications  Medication Dose Route Frequency Provider Last Rate Last Dose  . 0.9 %  sodium chloride infusion  250 mL Intravenous PRN Salary, Montell D, MD      . acetaminophen (TYLENOL) tablet 650 mg  650 mg Oral Q6H PRN Salary, Montell D, MD       Or  . acetaminophen (TYLENOL) suppository 650 mg   650 mg Rectal Q6H PRN Salary, Montell D, MD      . cefTRIAXone (ROCEPHIN) 2 g in dextrose 5 % 50 mL IVPB  2 g Intravenous Q24H Arta Silence, MD   Stopped at 03/23/17 1836  . feeding supplement (ENSURE ENLIVE) (ENSURE ENLIVE) liquid 237 mL  237 mL Oral TID BM Epifanio Lesches, MD   237 mL at 03/23/17 1314  . finasteride (PROSCAR) tablet 5 mg  5 mg Oral Daily Salary, Montell D, MD   5 mg at 03/23/17 0924  . furosemide (LASIX) injection 40 mg  40 mg Intravenous Q12H Epifanio Lesches, MD   40 mg at 03/23/17 0930  . heparin injection 5,000 Units  5,000 Units Subcutaneous Q8H Salary, Montell D, MD   5,000 Units at 03/23/17 1314  . HYDROcodone-acetaminophen (NORCO/VICODIN) 5-325 MG per tablet 1-2 tablet  1-2 tablet Oral Q4H PRN Salary, Avel Peace, MD   2 tablet at 03/22/17 1428  . lactulose (CHRONULAC) 10 GM/15ML solution 30 g  30 g Oral Daily Epifanio Lesches, MD   30 g at 03/23/17 1538  . midodrine (PROAMATINE) tablet 5 mg  5 mg Oral TID WC Dustin Flock, MD   5 mg at 03/23/17 1625  . multivitamin with minerals tablet 1 tablet  1 tablet Oral Daily Salary, Avel Peace, MD   1 tablet at 03/23/17 0924  . ondansetron (ZOFRAN) tablet 4 mg  4 mg Oral Q6H PRN Salary, Montell D, MD       Or  . ondansetron (ZOFRAN) injection 4 mg  4 mg Intravenous Q6H PRN Salary, Montell D, MD      . pantoprazole (PROTONIX) EC tablet 40 mg  40 mg Oral Daily Salary, Montell D, MD   40 mg at 03/23/17 0925  . polyethylene glycol (MIRALAX / GLYCOLAX) packet 17 g  17 g Oral Daily PRN Loney Hering D, MD   17 g at 03/23/17 0743  . sodium chloride flush (NS) 0.9 % injection 3 mL  3 mL Intravenous Q12H Salary, Montell D, MD   3 mL at 03/23/17 0925  . sodium chloride flush (NS) 0.9 % injection 3 mL  3 mL Intravenous PRN Salary, Montell D, MD      . spironolactone (ALDACTONE) tablet 50 mg  50 mg Oral Daily Salary, Montell D, MD   50 mg at 03/23/17 0925  . tamsulosin (FLOMAX) capsule 0.4 mg  0.4 mg Oral QPC breakfast  Salary, Montell D, MD   0.4 mg at 03/23/17 3903    Musculoskeletal: Strength & Muscle Tone: decreased Gait & Station: unable to stand Patient leans: N/A  Psychiatric Specialty Exam: Physical Exam  Nursing note and vitals reviewed. Constitutional: He appears well-developed and well-nourished.  HENT:  Head: Normocephalic and atraumatic.  Eyes: Conjunctivae are normal. Pupils are equal, round, and reactive to light.  Neck: Normal range of motion.  Cardiovascular: Normal heart sounds.  Respiratory: Effort normal.  GI: Soft. He exhibits distension. There is tenderness.  Musculoskeletal: Normal range of motion.  Neurological: He is alert.  Skin: Skin is warm and dry.  Psychiatric: He has a normal mood and affect. His speech is normal and behavior is normal. Judgment and thought content normal. Cognition and memory are normal.    Review of Systems  Constitutional: Negative.   HENT: Negative.   Eyes: Negative.   Respiratory: Negative.   Cardiovascular: Negative.   Gastrointestinal: Positive for abdominal pain.  Musculoskeletal: Negative.   Skin: Negative.   Neurological: Negative.   Psychiatric/Behavioral: Positive for substance abuse. Negative for depression, hallucinations, memory loss and suicidal ideas. The patient has insomnia. The patient is not nervous/anxious.     Blood pressure 105/76, pulse 85, temperature 98.4 F (36.9 C), temperature source Oral, resp. rate 17, height 5' 10" (1.778 m), weight 175 lb (79.4 kg), SpO2 96 %.Body mass index is 25.11 kg/m.  General Appearance: Fairly Groomed  Eye Contact:  Good  Speech:  Slow  Volume:  Decreased  Mood:  Euthymic  Affect:  Appropriate  Thought Process:  Goal Directed  Orientation:  Full (Time, Place, and Person)  Thought Content:  Logical  Suicidal Thoughts:  No  Homicidal Thoughts:  No  Memory:  Immediate;   Fair Recent;   Fair Remote;   Fair  Judgement:  Fair  Insight:  Fair  Psychomotor Activity:  Decreased   Concentration:  Concentration: Fair  Recall:  [employer] of Knowledge:  Fair  Language:  Fair  Akathisia:  No  Handed:  Right  AIMS (if indicated):     Assets:  Desire for Improvement Resilience  ADL's:  Impaired  Cognition:  Impaired,  Mild  Sleep:        Treatment Plan Summary: Plan 61 year old man with alcoholic cirrhosis.  To my interview this evening he was awake alert oriented and showed understanding of his illness.  He was able to articulate to me on his own that it was a fatal illness and that he was at significant risk of dying.  He was able to acknowledge to me that it  would probably be safer if he were living with someone or had someone living with him.  At the same time he said that he did not feel comfortable living with any family members.  He said he would prefer to go back to live in his home despite the limitations there.  He says he thinks that if he could just get his abdominal pain improved and get his catheter out that he would be able to go back to taking care of himself.  Patient at this point does appear to have capacity to make decisions.  These type of assessments are always difficult because of the vague nature of what is being considered.  However in this case the patient does understand his medical condition and the life and death risks involved.  He understands what is being offered as far as staying with family members.  Based on this he is making considered and capable judgments.  No evidence of any other psychiatric disorder requiring treatment.  I will follow up as needed.  Disposition: No evidence of imminent risk to self or others at present.   Patient does not meet criteria for psychiatric inpatient admission. Supportive therapy provided about ongoing stressors.  Alethia Berthold, MD 03/23/2017 7:39 PM

## 2017-03-24 ENCOUNTER — Inpatient Hospital Stay: Payer: Medicaid Other

## 2017-03-24 LAB — BASIC METABOLIC PANEL
ANION GAP: 10 (ref 5–15)
BUN: 15 mg/dL (ref 6–20)
CO2: 25 mmol/L (ref 22–32)
CREATININE: 0.83 mg/dL (ref 0.61–1.24)
Calcium: 8.9 mg/dL (ref 8.9–10.3)
Chloride: 96 mmol/L — ABNORMAL LOW (ref 101–111)
Glucose, Bld: 129 mg/dL — ABNORMAL HIGH (ref 65–99)
Potassium: 4.2 mmol/L (ref 3.5–5.1)
SODIUM: 131 mmol/L — AB (ref 135–145)

## 2017-03-24 LAB — AEROBIC/ANAEROBIC CULTURE W GRAM STAIN (SURGICAL/DEEP WOUND): Culture: NO GROWTH

## 2017-03-24 LAB — AEROBIC/ANAEROBIC CULTURE (SURGICAL/DEEP WOUND)

## 2017-03-24 MED ORDER — PROCHLORPERAZINE EDISYLATE 5 MG/ML IJ SOLN
10.0000 mg | Freq: Four times a day (QID) | INTRAMUSCULAR | Status: DC | PRN
  Administered 2017-03-24: 10 mg via INTRAVENOUS
  Filled 2017-03-24 (×2): qty 2

## 2017-03-24 NOTE — Progress Notes (Signed)
Patient complains of nausea. Patient given Zofran IV at 0317 and had one episode of emesis 100mL at 0529. Notified Dr. Sheryle Hailiamond and he order Compazine prn. Patient given compazine IV and now quiet in bed resting. Will continue to monitor patient.

## 2017-03-24 NOTE — Clinical Social Work Note (Signed)
[facility] [phone] and spoke to APS worker Jinny SandersMichelle Waddell to make APS report for possible self-neglect.  APS said they will staff the case and determine if patient will need APS services.  APS reported that regardless if they need APS involved they will do a outreach visit to see if APS needs to be involved.  CSW to sign off please reconsult if other social work needs arise.  Ervin KnackEric R. Hassan Rowannterhaus, MSW, Theresia MajorsLCSWA [phone]  03/24/2017 4:36 PM

## 2017-03-24 NOTE — Procedures (Signed)
Pre Procedural Dx: Symptomatic Ascites Post Procedural Dx: Same  Successful US guided paracentesis yielding 3.5 L of serous ascitic fluid.  EBL: None  Complications: None immediate  Jay Junita Kubota, MD Pager #: [phone]   

## 2017-03-24 NOTE — Care Management (Signed)
Patient has been declared competent.  Patient states that at discharge he will return to his home. Patient is agreeable to home health.  Patient states that he does not have a preference agency.  Heads up [facility] and [facility].  RNCM following for discharge needs.

## 2017-03-24 NOTE — Progress Notes (Signed)
[employer] - [place] at [facility]                                                                                                                                                                                  Patient Demographics   Patrick [name], is a 61 y.o. male, DOB - 1955/07/22, RUE:[id number]  Admit date - 03/17/2017   Admitting Physician Bertrum SolMontell D Salary, MD  Outpatient Primary MD for the patient is [facility], [facility]   LOS - 7  Subjective: c/oof the abdominal pain 10 out of 10 in severity associated with nausea, vomiting last night and he is very uncomfortable because of abdominal pain.  Patient has tense ascites.. Review of Systems:   CONSTITUTIONAL: No documented fever. No fatigue, weakness. No weight gain, no weight loss.  EYES: No blurry or double vision.  ENT: No tinnitus. No postnasal drip. No redness of the oropharynx.  RESPIRATORY: No cough, no wheeze, no hemoptysis. No dyspnea.  CARDIOVASCULAR: No chest pain. No orthopnea. No palpitations. No syncope.  GASTROINTESTINAL:, Vomiting.  Positive abdominal pain. No melena or hematochezia.  Feels like worsening ascites, abdominal pain GENITOURINARY: No dysuria or hematuria.  ENDOCRINE: No polyuria or nocturia. No heat or cold intolerance.  HEMATOLOGY: No anemia. No bruising. No bleeding.  INTEGUMENTARY: No rashes. No lesions.  MUSCULOSKELETAL: No arthritis. No swelling. No gout.  NEUROLOGIC: No numbness, tingling, or ataxia. No seizure-type activity.  PSYCHIATRIC: No anxiety. No insomnia. No ADD.    Vitals:   Vitals:   03/23/17 1600 03/23/17 1946 03/24/17 0512 03/24/17 0953  BP: 105/76 92/65 (!) 120/57 115/62  Pulse: 85 90 (!) 115 (!) 105  Resp: 17 14 18    Temp: 98.4 F (36.9 C) 97.8 F (36.6 C) 98.5 F (36.9 C)   TempSrc: Oral Oral Oral   SpO2: 96% 96% 100%   Weight:   78 kg (172 lb)   Height:        Wt Readings from Last 3 Encounters:  03/24/17 78 kg (172 lb)  03/08/17 80 kg  (176 lb 6.4 oz)  03/08/17 74.4 kg (164 lb)     Intake/Output Summary (Last 24 hours) at 03/24/2017 1129 Last data filed at 03/24/2017 1054 Gross per 24 hour  Intake 521 ml  Output 1250 ml  Net -729 ml    Physical Exam:   GENERAL: Pleasant-appearing in no apparent distress.  HEAD, EYES, EARS, NOSE AND THROAT: Atraumatic, normocephalic. Extraocular muscles are intact. Pupils equal and reactive to light. Sclerae anicteric. No conjunctival injection. No oro-pharyngeal erythema.  NECK: Supple. There is no jugular venous distention. No bruits, no lymphadenopathy, no thyromegaly.  HEART: Regular rate and rhythm,. No  murmurs, no rubs, no clicks.  LUNGS: Clear to auscultation bilaterally. No rales or rhonchi. No wheezes.  ABDOMEN: Tender,  distended. Has good bowel sounds. No hepatosplenomegaly appreciated, ascites, fluid thrill present.  Foley  Is draining clear yellow urine.   EXTREMITIES: No evidence of any cyanosis, clubbing, or peripheral edema.  +2 pedal and radial pulses bilaterally.  NEUROLOGIC: The patient is alert, awake, and oriented x3 with no focal motor or sensory deficits appreciated bilaterally. Some cognitive deficit. SKIN: Moist and warm with no rashes appreciated.  Psych: Not anxious, depressed LN: No inguinal LN enlargement    Antibiotics   Anti-infectives (From admission, onward)   Start     Dose/Rate Route Frequency Ordered Stop   03/17/17 2345  ceFEPIme (MAXIPIME) 2 g in dextrose 5 % 50 mL IVPB  Status:  Discontinued     2 g 100 mL/hr over 30 Minutes Intravenous Every 8 hours 03/17/17 2332 03/17/17 2353   03/17/17 1830  cefTRIAXone (ROCEPHIN) 2 g in dextrose 5 % 50 mL IVPB     2 g 100 mL/hr over 30 Minutes Intravenous Every 24 hours 03/17/17 1816        Medications   Scheduled Meds: . feeding supplement (ENSURE ENLIVE)  237 mL Oral TID BM  . finasteride  5 mg Oral Daily  . furosemide  40 mg Intravenous Q12H  . heparin  5,000 Units Subcutaneous Q8H  .  lactulose  30 g Oral Daily  . midodrine  5 mg Oral TID WC  . multivitamin with minerals  1 tablet Oral Daily  . pantoprazole  40 mg Oral Daily  . sodium chloride flush  3 mL Intravenous Q12H  . spironolactone  50 mg Oral Daily  . tamsulosin  0.4 mg Oral QPC breakfast   Continuous Infusions: . sodium chloride    . cefTRIAXone (ROCEPHIN)  IV Stopped (03/23/17 1836)   PRN Meds:.sodium chloride, acetaminophen **OR** acetaminophen, HYDROcodone-acetaminophen, ondansetron **OR** ondansetron (ZOFRAN) IV, polyethylene glycol, prochlorperazine, sodium chloride flush   Data Review:   Micro Results Recent Results (from the past 240 hour(s))  Body fluid culture     Status: None   Collection Time: 03/16/17  9:04 AM  Result Value Ref Range Status   Specimen Description   Final    PERITONEAL Performed at [facility], [address] [place], [place] [address]    Special Requests   Final    NONE Performed at [facility], [address] [place], [place] [address]    Gram Stain   Final    RARE WBC PRESENT,BOTH PMN AND MONONUCLEAR NO ORGANISMS SEEN    Culture   Final    NO GROWTH 3 DAYS Performed at [facility], [address], [place], [place] [address]    Report Status 03/19/2017 FINAL  Final  Aerobic/Anaerobic Culture (surgical/deep wound)     Status: None   Collection Time: 03/16/17  9:04 AM  Result Value Ref Range Status   Specimen Description   Final    PERITONEAL Performed at [facility], [address], [place], [place] [address]    Special Requests   Final    NONE Performed at [facility], [address] [place], [place] [address]    Gram Stain   Final    RARE WBC PRESENT,BOTH PMN AND MONONUCLEAR NO ORGANISMS SEEN    Culture   Final    No growth aerobically or anaerobically. Performed at [facility], [address] [place], [place] [address]    Report Status 03/21/2017 FINAL  Final  CULTURE, BLOOD (ROUTINE X  2) w Reflex to ID Panel     Status: None   Collection Time: 03/17/17 11:43 PM  Result Value Ref Range Status   Specimen Description BLOOD RIGHT ANTECUBITAL  Final   Special Requests   Final    BOTTLES DRAWN AEROBIC AND ANAEROBIC Blood Culture adequate volume   Culture   Final    NO GROWTH 5 DAYS Performed at [facility], [address] [place], [place] [address]    Report Status 03/23/2017 FINAL  Final  CULTURE, BLOOD (ROUTINE X 2) w Reflex to ID Panel     Status: None   Collection Time: 03/17/17 11:44 PM  Result Value Ref Range Status   Specimen Description BLOOD RIGHT ANTECUBITAL  Final   Special Requests   Final    BOTTLES DRAWN AEROBIC AND ANAEROBIC Blood Culture adequate volume   Culture   Final    NO GROWTH 5 DAYS Performed at [facility], [address] [place], [place] [address]    Report Status 03/23/2017 FINAL  Final  Aerobic/Anaerobic Culture (surgical/deep wound)     Status: None (Preliminary result)   Collection Time: 03/19/17  1:00 PM  Result Value Ref Range Status   Specimen Description   Final    PERITONEAL Performed at [facility], [address] [place], [place] [address]    Special Requests   Final    NONE Performed at [facility], [address] [place], [place] [address]    Gram Stain   Final    FEW WBC PRESENT,BOTH PMN AND MONONUCLEAR NO ORGANISMS SEEN    Culture   Final    NO GROWTH 4 DAYS NO ANAEROBES ISOLATED; CULTURE IN PROGRESS FOR 5 DAYS Performed at [facility], [address] [place], [place] [address]    Report Status PENDING  Incomplete    Radiology Reports Ct Abdomen Pelvis W Contrast  Result Date: 03/20/2017 CLINICAL DATA:  No surgery. NKI. Hx of liver cirrhosis. Pt c/o abdominal pain and distention since 12/21. Best images possible due to pt ability to hold breath. EXAM: CT ABDOMEN AND PELVIS WITH CONTRAST TECHNIQUE: Multidetector CT imaging of the abdomen and pelvis was  performed using the standard protocol following bolus administration of intravenous contrast. CONTRAST:  75mL ISOVUE-370 IOPAMIDOL (ISOVUE-370) INJECTION 76% COMPARISON:  01/14/2017 FINDINGS: Lower chest: Dependent atelectasis. No acute findings. Heart normal in size. Hepatobiliary: The liver top-normal in size. There is heterogeneous attenuation/enhancement. No discrete mass or focal lesion. There is decreased attenuation diffusely consistent with fatty infiltration. Gallbladder is unremarkable. No bile duct dilation. Pancreas: Unremarkable. No pancreatic ductal dilatation or surrounding inflammatory changes. Spleen: Normal in size without focal abnormality. Adrenals/Urinary Tract: No adrenal masses. Kidneys are normal size, orientation and position. No renal masses or stones. No hydronephrosis. Normal ureters. Bladder is thick walled. There is a left posterior diverticulum and a superior diverticulum. No discrete bladder mass. No stone. Bladder appearance is stable from the prior CT. Stomach/Bowel: No bowel dilation to suggest obstruction. There is no wall thickening or convincing inflammation. Small hiatal hernia. Stomach otherwise unremarkable. Normal appendix visualized. Vascular/Lymphatic: Atherosclerotic disease is noted throughout a normal caliber abdominal aorta extending into the iliac vessels. There are vascular collaterals in the upper abdomen with a patent paraumbilical vein and collaterals along the lesser curvature of the stomach. These findings are stable from the prior CT. Reproductive: Prostate  is enlarged bulging into the bladder base, unchanged from the prior CT. Other: Moderate ascites distends the abdomen increased from the prior CT. Musculoskeletal: No fracture or acute finding. No osteoblastic or osteolytic lesions. IMPRESSION: 1. Moderate ascites distends the abdomen. This has increased when compared to the prior CT, and accounts for abdominal distention. 2. Findings consistent with  cirrhosis, with superimposed hepatic steatosis, and portal venous hypertension reflected by venous collaterals and ascites. Liver appearance is stable from the prior study. 3. Thick walled bladder with diverticulum, along with an enlarged prostate. This is stable from the prior CT and suggests chronic bladder outlet obstruction. 4. Aortic atherosclerosis. Electronically Signed   By: Amie Portland M.D.   On: 03/20/2017 14:01   Mr Liver W Wo Contrast  Result Date: 03/09/2017 CLINICAL DATA:  Abnormal liver function tests. Recently diagnosed with cirrhosis. Recent paracentesis. EXAM: MRI ABDOMEN WITHOUT AND WITH CONTRAST TECHNIQUE: Multiplanar multisequence MR imaging of the abdomen was performed both before and after the administration of intravenous contrast. CONTRAST:  15 cc MultiHance COMPARISON:  03/08/2017 abdominal ultrasound.  01/16/2017 CT FINDINGS: Mild to moderate motion degradation throughout. Lower chest: Normal heart size without pericardial or pleural effusion. Hepatobiliary: Hepatomegaly, 20.0 cm craniocaudal. No significant steatosis. No definite evidence of cirrhosis. Normal gallbladder, without biliary ductal dilatation. Pancreas:  Normal, without mass or ductal dilatation. Spleen:  Normal in size, without focal abnormality. Adrenals/Urinary Tract: Normal adrenal glands. Normal kidneys, without hydronephrosis. Stomach/Bowel: Normal stomach and abdominal bowel loops. Vascular/Lymphatic: Aortic and branch vessel atherosclerosis. Patent portal and splenic veins. Other: Small volume ascites, relatively similar to 01/16/2017. Anasarca. Musculoskeletal: No acute osseous abnormality. IMPRESSION: 1. Motion degradation. 2. Hepatomegaly. No definite explanation for elevated liver function tests. 3. Small volume ascites, similar. Electronically Signed   By: Jeronimo Greaves M.D.   On: 03/09/2017 12:10   US Abdomen Limited  Result Date: 03/18/2017 CLINICAL DATA:  Cirrhosis. Ascites. Last paracentesis of  03/16/2017. EXAM: LIMITED ABDOMEN ULTRASOUND FOR ASCITES TECHNIQUE: Limited ultrasound survey for ascites was performed in all four abdominal quadrants. COMPARISON:  03/16/2017 FINDINGS: Moderate ascites distends the abdomen, noted in all 4 quadrants. IMPRESSION: Moderate ascites throughout the abdomen. Electronically Signed   By: Amie Portland M.D.   On: 03/18/2017 09:42   US Paracentesis  Result Date: 03/19/2017 INDICATION: Abdominal distention. Recurrent ascites. Request diagnostic therapeutic paracentesis. EXAM: ULTRASOUND GUIDED RIGHT LOWER QUADRANT PARACENTESIS MEDICATIONS: None. COMPLICATIONS: None immediate. PROCEDURE: Informed written consent was obtained from the patient after a discussion of the risks, benefits and alternatives to treatment. A timeout was performed prior to the initiation of the procedure. Initial ultrasound scanning demonstrates a large amount of ascites within the right lower abdominal quadrant. The right lower abdomen was prepped and draped in the usual sterile fashion. 1% lidocaine with epinephrine was used for local anesthesia. Following this, a Safe-T-Centesis catheter was introduced. An ultrasound image was saved for documentation purposes. The paracentesis was performed. The catheter was removed and a dressing was applied. The patient tolerated the procedure well without immediate post procedural complication. FINDINGS: A total of approximately 3.8 L of clear yellow fluid was removed. Samples were sent to the laboratory as requested by the clinical team. IMPRESSION: Successful ultrasound-guided paracentesis yielding 3.8 liters of peritoneal fluid. Read by: Brayton El PA-C Electronically Signed   By: Irish Lack M.D.   On: 03/19/2017 14:32   US Paracentesis  Result Date: 03/16/2017 INDICATION: Cirrhosis and ascites. EXAM: ULTRASOUND GUIDED PARACENTESIS MEDICATIONS: None. COMPLICATIONS: None immediate. PROCEDURE: Informed written consent  was obtained from the  patient after a discussion of the risks, benefits and alternatives to treatment. A timeout was performed prior to the initiation of the procedure. Initial ultrasound scanning was performed to localize ascites. The left lower abdomen was prepped and draped in the usual sterile fashion. 1% lidocaine was used for local anesthesia. Following this, a 6 Fr Safe-T-Centesis catheter was introduced. An ultrasound image was saved for documentation purposes. The paracentesis was performed. The catheter was removed and a dressing was applied. The patient tolerated the procedure well without immediate post procedural complication. FINDINGS: A total of approximately 4.2 L of yellow fluid was removed. IMPRESSION: Successful ultrasound-guided paracentesis yielding 4.2 liters of peritoneal fluid. Electronically Signed   By: Irish Lack M.D.   On: 03/16/2017 10:46   US Abdominal Pelvic Art/vent Flow Doppler  Result Date: 03/18/2017 CLINICAL DATA:  Ascites. EXAM: DUPLEX ULTRASOUND OF LIVER TECHNIQUE: Color and duplex Doppler ultrasound was performed to evaluate the hepatic in-flow and out-flow vessels. COMPARISON:  None. FINDINGS: Portal Vein Velocities Main:  26 cm/sec Right:  7 cm/sec Left:  8 cm/sec Hepatic Vein Velocities Right:  22 cm/sec Middle:  12 cm/sec Left:  6 cm/sec Hepatic Artery Velocity:  260 cm/sec Splenic Vein Velocity:  21 cm/sec Varices: None identified. Ascites: Small to moderate volume ascites present. There is no evidence of portal vein thrombus in the main portal vein appears normal in caliber. Portal vein flow is hepatopetal. Although the main portal vein velocity is not significantly reduced, intrahepatic portal vein velocity does appear to be significantly reduced and elevated hepatic artery velocities support underlying portal hypertension. Portal vein waveforms are somewhat pulsatile in appearance at the level of the main portal vein. However, there does appear to be some likely interference with  adjacent hepatic arterial transmission. Intrahepatic portal vein waveforms are not pulsatile. Pulsatile portal flow can be associated with right-sided heart failure, tricuspid regurgitation and cirrhosis with arterioportal shunting. No evidence of splenic vein thrombosis. No evidence of hepatic veno-occlusive disease. Venous waveforms are normal. IMPRESSION: Although main portal vein velocities are normal, significant reduction in intrahepatic portal vein velocities and corresponding high velocity in the hepatic artery are suggestive of some degree of underlying portal hypertension. No evidence of portal vein thrombus, reversed portal vein flow or portal vein dilatation. Electronically Signed   By: Irish Lack M.D.   On: 03/18/2017 15:28   US Abdomen Limited Ruq  Result Date: 03/08/2017 CLINICAL DATA:  Elevated bilirubin. EXAM: ULTRASOUND ABDOMEN LIMITED RIGHT UPPER QUADRANT COMPARISON:  CT abdomen 01/16/2017. FINDINGS: Gallbladder: Thick-walled gallbladder, up to 4.6 mm, without visible stones or sludge. Negative sonographic Murphy's sign. No pericholecystic fluid. Common bile duct: Diameter: Upper limits normal, 5.5 mm. Liver: Nodular contour of the liver, with increased echotexture, suggesting cirrhosis. No focal lesions. Portal vein is patent on color Doppler imaging with normal direction of blood flow towards the liver. Incidental note is made of moderate ascites. IMPRESSION: Findings consistent with cirrhosis of the liver, with probable secondary ascites. No gallstones, but thick-walled gallbladder, almost 5 mm. No biliary ductal dilatation. Electronically Signed   By: Elsie Stain M.D.   On: 03/08/2017 18:48     CBC Recent Labs  Lab 03/17/17 1736 03/17/17 2344 03/23/17 0348  WBC 8.7 8.9 7.6  HGB 13.9 13.5 12.4*  HCT 40.6 38.8* 35.8*  PLT 221 212 201  MCV 100.0 99.0 99.4  MCH 34.3* 34.4* 34.5*  MCHC 34.3 34.8 34.7  RDW 15.5* 15.6* 15.6*  LYMPHSABS 3.1 3.7*  --  MONOABS 1.3* 1.4*   --   EOSABS 0.1 0.1  --   BASOSABS 0.0 0.1  --     Chemistries  Recent Labs  Lab 03/17/17 1736  03/20/17 0403 03/21/17 0313 03/22/17 0308 03/23/17 1129 03/24/17 0352  NA 128*  --  131* 129* 129* 131* 131*  K 5.1  --  4.1 4.3 3.9 4.1 4.2  CL 96*  --  100* 97* 98* 97* 96*  CO2 22  --  23 25 24 27 25   GLUCOSE 155*  --  101* 98 90 127* 129*  BUN 16  --  11 9 9 12 15   CREATININE 0.83   < > 0.80 0.84 0.85 0.81 0.83  CALCIUM 8.7*  --  8.7* 8.8* 8.5* 8.5* 8.9  AST 106*  --   --  72*  --   --   --   ALT 48  --   --  33  --   --   --   ALKPHOS 990*  --   --  694*  --   --   --   BILITOT 7.9*  --   --  7.3*  --   --   --    < > = values in this interval not displayed.   ------------------------------------------------------------------------------------------------------------------ estimated creatinine clearance is 96.5 mL/min (by C-G formula based on SCr of 0.83 mg/dL). ------------------------------------------------------------------------------------------------------------------ No results for input(s): HGBA1C in the last 72 hours. ------------------------------------------------------------------------------------------------------------------ No results for input(s): CHOL, HDL, LDLCALC, TRIG, CHOLHDL, LDLDIRECT in the last 72 hours. ------------------------------------------------------------------------------------------------------------------ No results for input(s): TSH, T4TOTAL, T3FREE, THYROIDAB in the last 72 hours.  Invalid input(s): FREET3 ------------------------------------------------------------------------------------------------------------------ No results for input(s): VITAMINB12, FOLATE, FERRITIN, TIBC, IRON, RETICCTPCT in the last 72 hours.  Coagulation profile Recent Labs  Lab 03/17/17 1736  INR 1.10    No results for input(s): DDIMER in the last 72 hours.  Cardiac Enzymes Recent Labs  Lab 03/17/17 1736  TROPONINI 0.03*    ------------------------------------------------------------------------------------------------------------------ Invalid input(s): POCBNP    Assessment & Plan  Patient is a 61 year old with liver cirrhosis with ascites   IMPRESSION AND PLAN: 1  spontaneous bacterial peritonitis: On IV Rocephin, no fever, consistently has severe abdominal pain and requesting paracentesis again. On IV Rocephin.  Continue IV Lasix, seen by gastroenterology, continue antibiotics  Request ultrasound again to see if he can get paracentesis again.  Told him that he is going to have recurrent abdominal ascites due to cirrhosis and his prognosis is really poor.  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   Patient has had extensive workup for his liver cirrhosis in the past Watch for renal function, use IV albumin if needed.   3 ch chronic BPH, urinary retention now.  Foley catheter is inserted.  Having good urine output.   4 chronic benign essential hypertension';   Hypotension ,continue  midodrine, continue IV Lasix, Aldactone,   5.  Hyponatremia related to liver cirrhosis and ascites continue to follow   #6 upper endoscopy as an outpatient for esophageal viruses.  #6 constipation: Continue stool softeners. Likely discharge by Saturday with p.o. Lasix.     Code Status Orders  (From admission, onward)        Start     Ordered   03/17/17 2333  Full code  Continuous     03/17/17 2332    Code Status History    Date Active Date Inactive Code  Status Order ID Comments User Context   03/08/2017 23:25 03/09/2017 19:45 Full Code [id number]  Oralia Manis, MD Inpatient           Consults neurology   DVT Prophylaxis SCDs Lab Results  Component Value Date   PLT 201 03/23/2017     Time Spent in minutes 32 minutes  Greater than 50% of time spent in care coordination and  counseling patient regarding the condition and plan of care.   Katha Hamming M.D on 03/24/2017 at 11:29 AM  Between 7am to 6pm - Pager - [phone]  After 6pm go to [email or web address] - password [email or web address] [facility]  [facility]   Office  [phone]

## 2017-03-24 NOTE — Consult Note (Signed)
Patrick [name] Psychiatry Consult   Reason for Consult: Follow-up consult for 61 year old [name] with a past history of alcohol abuse and cirrhosis. Referring Physician:  Vianne Bulls Patient Identification: Patrick [name] MRN:  [id number] Principal Diagnosis: Cirrhosis of liver [facility]) Diagnosis:   Patient Active Problem List   Diagnosis Date Noted  . Cirrhosis of liver ([place]) [K74.60] 03/17/2017  . Alcoholic cirrhosis of liver with ascites ([place]) [K70.31] 03/08/2017  . Hyperbilirubinemia [E80.6] 03/08/2017  . HTN (hypertension) [I10] 03/08/2017  . BPH with obstruction/lower urinary tract symptoms [N40.1, N13.8] 07/22/2015  . Urinary frequency [R35.0] 07/22/2015  . Phimosis [N47.1] 07/22/2015    Total Time spent with patient: 30 minutes  Subjective:   Patrick [name] is a 61 y.o. male patient admitted with "I am okay".  HPI: Patient seen for follow-up.  Chart reviewed.  61 year old [name] with cirrhosis.  He has had some paracentesis with removal of fluid which is what he was looking forward to yesterday.  He admits that he feels a little better from that standpoint but still is uncomfortable.  Additionally today during the interview the patient seemed to be mentally a little more sluggish.  He got confused and his speech more easily.  Made some mistakes and seemed to lose the thread of conversation more easily.  Did not report any psychotic symptoms or any suicidal ideation with care it was not difficult to guide him back to the point of the conversation.  Patient continues to state that he plans to go back home.  I pointed out to him how difficult it will be for him to take care of himself in his home environment with his current limitations and he admitted that that was true and that he is thinking a little bit more about possibly staying with family.  Past Psychiatric History: No specific past history I do not think other than alcohol abuse.  Possible distant history of depression.  Risk to Self: Is  patient at risk for suicide?: No Risk to Others:   Prior Inpatient Therapy:   Prior Outpatient Therapy:    Past Medical History:  Past Medical History:  Diagnosis Date  . Aortic atherosclerosis ([place]) 12/2016  . Arthritis   . BPH (benign prostatic hyperplasia)   . Cirrhosis of liver ([place])   . Depression   . HTN (hypertension)   . Metatarsal fracture    2nd toe of left foot  . Tuberculosis   . Urinary frequency     Past Surgical History:  Procedure Laterality Date  . NO PAST SURGERIES    . None     Family History:  Family History  Problem Relation Age of Onset  . Hematuria Unknown   . Tuberculosis Unknown   . Diabetes Mother   . Hypertension Mother   . Kidney disease Neg Hx   . Prostate cancer Neg Hx    Family Psychiatric  History: none Social History:  Social History   Substance and Sexual Activity  Alcohol Use Yes  . Alcohol/week: 0.0 oz   Comment: daily use up until October 2018     Social History   Substance and Sexual Activity  Drug Use No    Social History   Socioeconomic History  . Marital status: Single    Spouse name: None  . Number of children: None  . Years of education: None  . Highest education level: None  Social Needs  . Financial resource strain: None  . Food insecurity - worry: None  . Food insecurity - inability:  None  . Transportation needs - medical: None  . Transportation needs - non-medical: None  Occupational History  . None  Tobacco Use  . Smoking status: Current Every Day Smoker    Packs/day: 0.50  . Smokeless tobacco: Never Used  Substance and Sexual Activity  . Alcohol use: Yes    Alcohol/week: 0.0 oz    Comment: daily use up until October 2018  . Drug use: No  . Sexual activity: None  Other Topics Concern  . None  Social History Narrative  . None   Additional Social History:    Allergies:  No Known Allergies  Labs:  Results for orders placed or performed during the hospital encounter of 03/17/17 (from the  past 48 hour(s))  CBC     Status: Abnormal   Collection Time: 03/23/17  3:48 AM  Result Value Ref Range   WBC 7.6 3.8 - 10.6 K/uL   RBC 3.60 (L) 4.40 - 5.90 MIL/uL   Hemoglobin 12.4 (L) 13.0 - 18.0 g/dL   HCT 35.8 (L) 40.0 - 52.0 %   MCV 99.4 80.0 - 100.0 fL   MCH 34.5 (H) 26.0 - 34.0 pg   MCHC 34.7 32.0 - 36.0 g/dL   RDW 15.6 (H) 11.5 - 14.5 %   Platelets 201 150 - 440 K/uL    Comment: Performed at [facility], [address] [place], [place] [address]  Basic metabolic panel     Status: Abnormal   Collection Time: 03/23/17 11:29 AM  Result Value Ref Range   Sodium 131 (L) 135 - 145 mmol/L   Potassium 4.1 3.5 - 5.1 mmol/L   Chloride 97 (L) 101 - 111 mmol/L   CO2 27 22 - 32 mmol/L   Glucose, Bld 127 (H) 65 - 99 mg/dL   BUN 12 6 - 20 mg/dL   Creatinine, Ser 0.81 0.61 - 1.24 mg/dL   Calcium 8.5 (L) 8.9 - 10.3 mg/dL   GFR calc non Af Amer >60 >60 mL/min   GFR calc Af Amer >60 >60 mL/min    Comment: (NOTE) The eGFR has been calculated using the CKD EPI equation. This calculation has not been validated in all clinical situations. eGFR's persistently <60 mL/min signify possible Chronic Kidney Disease.    Anion gap 7 5 - 15    Comment: Performed at [facility], [address] [place], [place] [address]  Basic metabolic panel     Status: Abnormal   Collection Time: 03/24/17  3:52 AM  Result Value Ref Range   Sodium 131 (L) 135 - 145 mmol/L   Potassium 4.2 3.5 - 5.1 mmol/L   Chloride 96 (L) 101 - 111 mmol/L   CO2 25 22 - 32 mmol/L   Glucose, Bld 129 (H) 65 - 99 mg/dL   BUN 15 6 - 20 mg/dL   Creatinine, Ser 0.83 0.61 - 1.24 mg/dL   Calcium 8.9 8.9 - 10.3 mg/dL   GFR calc non Af Amer >60 >60 mL/min   GFR calc Af Amer >60 >60 mL/min    Comment: (NOTE) The eGFR has been calculated using the CKD EPI equation. This calculation has not been validated in all clinical situations. eGFR's persistently <60 mL/min signify possible Chronic Kidney Disease.     Anion gap 10 5 - 15    Comment: Performed at [facility], [address] [place], [place] [address]    Current Facility-Administered Medications  Medication Dose Route Frequency Provider Last Rate Last Dose  .  0.9 %  sodium chloride infusion  250 mL Intravenous PRN Salary, Montell D, MD      . acetaminophen (TYLENOL) tablet 650 mg  650 mg Oral Q6H PRN Salary, Montell D, MD       Or  . acetaminophen (TYLENOL) suppository 650 mg  650 mg Rectal Q6H PRN Salary, Montell D, MD      . cefTRIAXone (ROCEPHIN) 2 g in dextrose 5 % 50 mL IVPB  2 g Intravenous Q24H Arta Silence, MD   Stopped at 03/23/17 1836  . feeding supplement (ENSURE ENLIVE) (ENSURE ENLIVE) liquid 237 mL  237 mL Oral TID BM Epifanio Lesches, MD   237 mL at 03/24/17 1304  . finasteride (PROSCAR) tablet 5 mg  5 mg Oral Daily Salary, Montell D, MD   5 mg at 03/24/17 2637  . furosemide (LASIX) injection 40 mg  40 mg Intravenous Q12H Epifanio Lesches, MD   40 mg at 03/24/17 0954  . heparin injection 5,000 Units  5,000 Units Subcutaneous Q8H Salary, Montell D, MD   5,000 Units at 03/24/17 0541  . HYDROcodone-acetaminophen (NORCO/VICODIN) 5-325 MG per tablet 1-2 tablet  1-2 tablet Oral Q4H PRN Salary, Avel Peace, MD   2 tablet at 03/24/17 1001  . lactulose (CHRONULAC) 10 GM/15ML solution 30 g  30 g Oral Daily Epifanio Lesches, MD   30 g at 03/24/17 8588  . midodrine (PROAMATINE) tablet 5 mg  5 mg Oral TID WC Dustin Flock, MD   5 mg at 03/24/17 1632  . multivitamin with minerals tablet 1 tablet  1 tablet Oral Daily Salary, Avel Peace, MD   1 tablet at 03/24/17 [phone]  . ondansetron (ZOFRAN) tablet 4 mg  4 mg Oral Q6H PRN Salary, Montell D, MD       Or  . ondansetron (ZOFRAN) injection 4 mg  4 mg Intravenous Q6H PRN Salary, Montell D, MD   4 mg at 03/24/17 0317  . pantoprazole (PROTONIX) EC tablet 40 mg  40 mg Oral Daily Salary, Montell D, MD   40 mg at 03/24/17 7412  . polyethylene glycol (MIRALAX / GLYCOLAX)  packet Patrick g  Patrick g Oral Daily PRN Loney Hering D, MD   Patrick g at 03/23/17 0743  . prochlorperazine (COMPAZINE) injection 10 mg  10 mg Intravenous Q6H PRN Harrie Foreman, MD   10 mg at 03/24/17 0606  . sodium chloride flush (NS) 0.9 % injection 3 mL  3 mL Intravenous Q12H Salary, Montell D, MD   3 mL at 03/24/17 0953  . sodium chloride flush (NS) 0.9 % injection 3 mL  3 mL Intravenous PRN Salary, Montell D, MD      . spironolactone (ALDACTONE) tablet 50 mg  50 mg Oral Daily Salary, Montell D, MD   50 mg at 03/24/17 0952  . tamsulosin (FLOMAX) capsule 0.4 mg  0.4 mg Oral QPC breakfast Salary, Montell D, MD   0.4 mg at 03/24/17 8786    Musculoskeletal: Strength & Muscle Tone: decreased Gait & Station: unsteady Patient leans: N/A  Psychiatric Specialty Exam: Physical Exam  Nursing note and vitals reviewed. Constitutional: He appears well-developed and well-nourished.  HENT:  Head: Normocephalic and atraumatic.  Eyes: Conjunctivae are normal. Pupils are equal, round, and reactive to light.  Neck: Normal range of motion.  Cardiovascular: Regular rhythm and normal heart sounds.  Respiratory: Effort normal. No respiratory distress.  GI: Soft. There is tenderness.  Musculoskeletal: Normal range of motion.  Neurological: He is alert.  Skin: Skin  is warm and dry.  Psychiatric: His affect is blunt. His speech is delayed. He is slowed. Thought content is not paranoid. He expresses impulsivity. He expresses no homicidal and no suicidal ideation. He exhibits abnormal recent memory.    Review of Systems  Constitutional: Negative.   HENT: Negative.   Eyes: Negative.   Respiratory: Negative.   Cardiovascular: Negative.   Gastrointestinal: Positive for abdominal pain.  Musculoskeletal: Negative.   Skin: Negative.   Neurological: Negative.   Psychiatric/Behavioral: Positive for memory loss. Negative for depression, hallucinations, substance abuse and suicidal ideas. The patient is not  nervous/anxious and does not have insomnia.     Blood pressure 100/60, pulse (!) 102, temperature 98.3 F (36.8 C), temperature source Oral, resp. rate 16, height 5' 10"  (1.778 m), weight 172 lb (78 kg), SpO2 95 %.Body mass index is 24.68 kg/m.  General Appearance: Casual  Eye Contact:  Minimal  Speech:  Garbled and Slow  Volume:  Decreased  Mood:  Dysphoric  Affect:  Constricted  Thought Process:  Disorganized  Orientation:  Full (Time, Place, and Person)  Thought Content:  Rumination and Tangential  Suicidal Thoughts:  No  Homicidal Thoughts:  No  Memory:  Immediate;   Fair Recent;   Fair Remote;   Fair  Judgement:  Fair  Insight:  Shallow  Psychomotor Activity:  Decreased  Concentration:  Concentration: Fair  Recall:  [employer] of Knowledge:  Fair  Language:  Fair  Akathisia:  No  Handed:  Right  AIMS (if indicated):     Assets:  Desire for Improvement Housing Resilience Social Support  ADL's:  Impaired  Cognition:  Impaired,  Mild  Sleep:        Treatment Plan Summary: Plan Patient looks a little bit worse today.  Cirrhosis is obviously a condition that is well-known to have variable mental status as part of the illness.  Removal of so much fluid can cause significant electrolyte shifts and other changes that could affect his level of delirium.  I will not make a change to my opinion that he has capacity to make decisions but I am a little bit more worried today about how well he is going to cope at home.  Tried to point this out to him and he seemed open to the idea of receiving some help.  I am not clear whether he is leaving today or will still be in the hospital over the weekend but I will sign it out.  Disposition: Patient does not meet criteria for psychiatric inpatient admission. Supportive therapy provided about ongoing stressors. Discussed crisis plan, support from social network, calling 911, coming to the Emergency Department, and calling Suicide  Hotline.  Alethia Berthold, MD 03/24/2017 5:34 PM

## 2017-03-25 NOTE — Progress Notes (Addendum)
[employer] [facility] [place] [facility]                                                                                                                                                                                  Patient Demographics   Patrick [name], is a 61 y.o. male, DOB - 1955-12-02, ZOX:[id number]  Admit date - 03/17/2017   Admitting Physician Bertrum Sol, MD  Outpatient Primary MD for the patient is Center[facility]   LOS - 8  Subjective: Status post paracentesis, 3 L of fluid drained yesterday, he feels better today.  Patient is asking the same question again and again about his ascites and also not getting better I told him that he has liver cirrhosis and end-stage liver disease and ascites can come back again and again.  And he also wanted to get physical therapy to see if he can walk in preparation for discharge.  Still has very poor p.o. intake, constipation.. Review of Systems:   CONSTITUTIONAL: No documented fever. No fatigue, weakness. No weight gain, no weight loss.  EYES: No blurry or double vision.  ENT: No tinnitus. No postnasal drip. No redness of the oropharynx.  RESPIRATORY: No cough, no wheeze, no hemoptysis. No dyspnea.  CARDIOVASCULAR: No chest pain. No orthopnea. No palpitations. No syncope.  GASTROINTESTINAL:, No vomiting, decreased abdominal pain, decreased ascites. GENITOURINARY: No dysuria or hematuria.  ENDOCRINE: No polyuria or nocturia. No heat or cold intolerance.  HEMATOLOGY: No anemia. No bruising. No bleeding.  INTEGUMENTARY: No rashes. No lesions.  MUSCULOSKELETAL: No arthritis. No swelling. No gout.  NEUROLOGIC: No numbness, tingling, or ataxia. No seizure-type activity.  PSYCHIATRIC: No anxiety. No insomnia. No ADD.    Vitals:   Vitals:   03/24/17 1413 03/24/17 1429 03/24/17 1952 03/25/17 0429  BP: 108/60 100/60 (!) 89/60 109/61  Pulse: 98 (!) 102 96 97  Resp:  16 16 16   Temp:  98.3 F (36.8 C) 99 F (37.2  C) 98.5 F (36.9 C)  TempSrc:  Oral Oral Oral  SpO2: 94% 95% 94% 93%  Weight:    74.1 kg (163 lb 6.4 oz)  Height:        Wt Readings from Last 3 Encounters:  03/25/17 74.1 kg (163 lb 6.4 oz)  03/08/17 80 kg (176 lb 6.4 oz)  03/08/17 74.4 kg (164 lb)     Intake/Output Summary (Last 24 hours) at 03/25/2017 1010 Last data filed at 03/25/2017 0430 Gross per 24 hour  Intake 525 ml  Output 1675 ml  Net -1150 ml    Physical Exam:   GENERAL: Pleasant-appearing in no apparent distress.  HEAD, EYES, EARS, NOSE AND THROAT:  Atraumatic, normocephalic. Extraocular muscles are intact. Pupils equal and reactive to light. Sclerae anicteric. No conjunctival injection. No oro-pharyngeal erythema.  NECK: Supple. There is no jugular venous distention. No bruits, no lymphadenopathy, no thyromegaly.  HEART: Regular rate and rhythm,. No murmurs, no rubs, no clicks.  LUNGS: Clear to auscultation bilaterally. No rales or rhonchi. No wheezes.  ABDOMEN: Bowel sounds present, distended but no tenderness.  Ascites present. EXTREMITIES: No evidence of any cyanosis, clubbing, or peripheral edema.  +2 pedal and radial pulses bilaterally.  NEUROLOGIC: The patient is alert, awake, and oriented x3 with no focal motor or sensory deficits appreciated bilaterally. Some cognitive deficit. SKIN: Moist and warm with no rashes appreciated.  Psych: Not anxious, depressed LN: No inguinal LN enlargement    Antibiotics   Anti-infectives (From admission, onward)   Start     Dose/Rate Route Frequency Ordered Stop   03/17/17 2345  ceFEPIme (MAXIPIME) 2 g in dextrose 5 % 50 mL IVPB  Status:  Discontinued     2 g 100 mL/hr over 30 Minutes Intravenous Every 8 hours 03/17/17 2332 03/17/17 2353   03/17/17 1830  cefTRIAXone (ROCEPHIN) 2 g in dextrose 5 % 50 mL IVPB     2 g 100 mL/hr over 30 Minutes Intravenous Every 24 hours 03/17/17 1816        Medications   Scheduled Meds: . feeding supplement (ENSURE ENLIVE)  237  mL Oral TID BM  . finasteride  5 mg Oral Daily  . furosemide  40 mg Intravenous Q12H  . heparin  5,000 Units Subcutaneous Q8H  . lactulose  30 g Oral Daily  . midodrine  5 mg Oral TID WC  . multivitamin with minerals  1 tablet Oral Daily  . pantoprazole  40 mg Oral Daily  . sodium chloride flush  3 mL Intravenous Q12H  . spironolactone  50 mg Oral Daily  . tamsulosin  0.4 mg Oral QPC breakfast   Continuous Infusions: . sodium chloride    . cefTRIAXone (ROCEPHIN)  IV Stopped (03/24/17 1808)   PRN Meds:.sodium chloride, acetaminophen **OR** acetaminophen, HYDROcodone-acetaminophen, ondansetron **OR** ondansetron (ZOFRAN) IV, polyethylene glycol, prochlorperazine, sodium chloride flush   Data Review:   Micro Results Recent Results (from the past 240 hour(s))  Body fluid culture     Status: None   Collection Time: 03/16/17  9:04 AM  Result Value Ref Range Status   Specimen Description   Final    PERITONEAL Performed at [facility], [address], [place], [place] [address]    Special Requests   Final    NONE Performed at [facility], [address] [place], [place] [address]    Gram Stain   Final    RARE WBC PRESENT,BOTH PMN AND MONONUCLEAR NO ORGANISMS SEEN    Culture   Final    NO GROWTH 3 DAYS Performed at [facility], [address], [place], [place] [address]    Report Status 03/19/2017 FINAL  Final  Aerobic/Anaerobic Culture (surgical/deep wound)     Status: None   Collection Time: 03/16/17  9:04 AM  Result Value Ref Range Status   Specimen Description   Final    PERITONEAL Performed at [facility], [address], [place], [place] [address]    Special Requests   Final    NONE Performed at [facility], [address] [place], [place] [address]    Gram Stain   Final    RARE WBC PRESENT,BOTH PMN AND  MONONUCLEAR NO ORGANISMS SEEN    Culture   Final    No growth aerobically or anaerobically. Performed at  [facility], [address], [place], [place] [address]    Report Status 03/21/2017 FINAL  Final  CULTURE, BLOOD (ROUTINE X 2) w Reflex to ID Panel     Status: None   Collection Time: 03/17/17 11:43 PM  Result Value Ref Range Status   Specimen Description BLOOD RIGHT ANTECUBITAL  Final   Special Requests   Final    BOTTLES DRAWN AEROBIC AND ANAEROBIC Blood Culture adequate volume   Culture   Final    NO GROWTH 5 DAYS Performed at [facility], [address] [place], [place] [address]    Report Status 03/23/2017 FINAL  Final  CULTURE, BLOOD (ROUTINE X 2) w Reflex to ID Panel     Status: None   Collection Time: 03/17/17 11:44 PM  Result Value Ref Range Status   Specimen Description BLOOD RIGHT ANTECUBITAL  Final   Special Requests   Final    BOTTLES DRAWN AEROBIC AND ANAEROBIC Blood Culture adequate volume   Culture   Final    NO GROWTH 5 DAYS Performed at [facility], [address] [place], [place] [address]    Report Status 03/23/2017 FINAL  Final  Aerobic/Anaerobic Culture (surgical/deep wound)     Status: None   Collection Time: 03/19/17  1:00 PM  Result Value Ref Range Status   Specimen Description   Final    PERITONEAL Performed at [facility], [address] [place], [place] [address]    Special Requests   Final    NONE Performed at [facility], [address] [place], [place] [address]    Gram Stain   Final    FEW WBC PRESENT,BOTH PMN AND MONONUCLEAR NO ORGANISMS SEEN    Culture   Final    No growth aerobically or anaerobically. Performed at [facility], [address] [place], [place] [address]    Report Status 03/24/2017 FINAL  Final    Radiology Reports Ct Abdomen Pelvis W Contrast  Result Date: 03/20/2017 CLINICAL DATA:  No surgery. NKI. Hx of liver cirrhosis. Pt c/o abdominal pain and distention since 12/21. Best images possible due to pt ability to hold breath. EXAM: CT ABDOMEN AND PELVIS  WITH CONTRAST TECHNIQUE: Multidetector CT imaging of the abdomen and pelvis was performed using the standard protocol following bolus administration of intravenous contrast. CONTRAST:  75mL ISOVUE-370 IOPAMIDOL (ISOVUE-370) INJECTION 76% COMPARISON:  01/14/2017 FINDINGS: Lower chest: Dependent atelectasis. No acute findings. Heart normal in size. Hepatobiliary: The liver top-normal in size. There is heterogeneous attenuation/enhancement. No discrete mass or focal lesion. There is decreased attenuation diffusely consistent with fatty infiltration. Gallbladder is unremarkable. No bile duct dilation. Pancreas: Unremarkable. No pancreatic ductal dilatation or surrounding inflammatory changes. Spleen: Normal in size without focal abnormality. Adrenals/Urinary Tract: No adrenal masses. Kidneys are normal size, orientation and position. No renal masses or stones. No hydronephrosis. Normal ureters. Bladder is thick walled. There is a left posterior diverticulum and a superior diverticulum. No discrete bladder mass. No stone. Bladder appearance is stable from the prior CT. Stomach/Bowel: No bowel dilation to suggest obstruction. There is no wall thickening or convincing inflammation. Small hiatal hernia. Stomach otherwise unremarkable. Normal appendix visualized. Vascular/Lymphatic: Atherosclerotic disease is noted throughout a normal caliber abdominal aorta extending into the iliac vessels. There are vascular collaterals in the upper abdomen with a patent paraumbilical vein  and collaterals along the lesser curvature of the stomach. These findings are stable from the prior CT. Reproductive: Prostate is enlarged bulging into the bladder base, unchanged from the prior CT. Other: Moderate ascites distends the abdomen increased from the prior CT. Musculoskeletal: No fracture or acute finding. No osteoblastic or osteolytic lesions. IMPRESSION: 1. Moderate ascites distends the abdomen. This has increased when compared to the prior  CT, and accounts for abdominal distention. 2. Findings consistent with cirrhosis, with superimposed hepatic steatosis, and portal venous hypertension reflected by venous collaterals and ascites. Liver appearance is stable from the prior study. 3. Thick walled bladder with diverticulum, along with an enlarged prostate. This is stable from the prior CT and suggests chronic bladder outlet obstruction. 4. Aortic atherosclerosis. Electronically Signed   By: Amie Portlandavid  Ormond M.D.   On: 03/20/2017 14:01   Mr Liver W Wo Contrast  Result Date: 03/09/2017 CLINICAL DATA:  Abnormal liver function tests. Recently diagnosed with cirrhosis. Recent paracentesis. EXAM: MRI ABDOMEN WITHOUT AND WITH CONTRAST TECHNIQUE: Multiplanar multisequence MR imaging of the abdomen was performed both before and after the administration of intravenous contrast. CONTRAST:  15 cc MultiHance COMPARISON:  03/08/2017 abdominal ultrasound.  01/16/2017 CT FINDINGS: Mild to moderate motion degradation throughout. Lower chest: Normal heart size without pericardial or pleural effusion. Hepatobiliary: Hepatomegaly, 20.0 cm craniocaudal. No significant steatosis. No definite evidence of cirrhosis. Normal gallbladder, without biliary ductal dilatation. Pancreas:  Normal, without mass or ductal dilatation. Spleen:  Normal in size, without focal abnormality. Adrenals/Urinary Tract: Normal adrenal glands. Normal kidneys, without hydronephrosis. Stomach/Bowel: Normal stomach and abdominal bowel loops. Vascular/Lymphatic: Aortic and branch vessel atherosclerosis. Patent portal and splenic veins. Other: Small volume ascites, relatively similar to 01/16/2017. Anasarca. Musculoskeletal: No acute osseous abnormality. IMPRESSION: 1. Motion degradation. 2. Hepatomegaly. No definite explanation for elevated liver function tests. 3. Small volume ascites, similar. Electronically Signed   By: Jeronimo GreavesKyle  Talbot M.D.   On: 03/09/2017 12:10   [place] Abdomen Limited  Result Date:  03/18/2017 CLINICAL DATA:  Cirrhosis. Ascites. Last paracentesis of 03/16/2017. EXAM: LIMITED ABDOMEN ULTRASOUND FOR ASCITES TECHNIQUE: Limited ultrasound survey for ascites was performed in all four abdominal quadrants. COMPARISON:  03/16/2017 FINDINGS: Moderate ascites distends the abdomen, noted in all 4 quadrants. IMPRESSION: Moderate ascites throughout the abdomen. Electronically Signed   By: Amie Portlandavid  Ormond M.D.   On: 03/18/2017 09:42   [place] Paracentesis  Result Date: 03/24/2017 INDICATION: History of cirrhosis, now with recurrent symptomatic ascites. EXAM: ULTRASOUND-GUIDED PARACENTESIS COMPARISON:  Ultrasound-guided paracentesis- 03/19/2017; 03/16/2017 MEDICATIONS: None. COMPLICATIONS: None immediate. TECHNIQUE: Informed written consent was obtained from the patient after a discussion of the risks, benefits and alternatives to treatment. A timeout was performed prior to the initiation of the procedure. Initial ultrasound scanning demonstrates a moderate amount of ascites within the right lower abdominal quadrant. The right lower abdomen was prepped and draped in the usual sterile fashion. 1% lidocaine with epinephrine was used for local anesthesia. An ultrasound image was saved for documentation purposed. An 8 Fr Safe-T-Centesis catheter was introduced. The paracentesis was performed. The catheter was removed and a dressing was applied. The patient tolerated the procedure well without immediate post procedural complication. FINDINGS: A total of approximately 3.5 liters of serous fluid was removed. IMPRESSION: Successful ultrasound-guided paracentesis yielding 3.5 liters of peritoneal fluid. Electronically Signed   By: Simonne ComeJohn  Watts M.D.   On: 03/24/2017 14:30   [place] Paracentesis  Result Date: 03/19/2017 INDICATION: Abdominal distention. Recurrent ascites. Request diagnostic therapeutic paracentesis. EXAM: ULTRASOUND GUIDED RIGHT LOWER  QUADRANT PARACENTESIS MEDICATIONS: None. COMPLICATIONS: None immediate.  PROCEDURE: Informed written consent was obtained from the patient after a discussion of the risks, benefits and alternatives to treatment. A timeout was performed prior to the initiation of the procedure. Initial ultrasound scanning demonstrates a large amount of ascites within the right lower abdominal quadrant. The right lower abdomen was prepped and draped in the usual sterile fashion. 1% lidocaine with epinephrine was used for local anesthesia. Following this, a Safe-T-Centesis catheter was introduced. An ultrasound image was saved for documentation purposes. The paracentesis was performed. The catheter was removed and a dressing was applied. The patient tolerated the procedure well without immediate post procedural complication. FINDINGS: A total of approximately 3.8 L of clear yellow fluid was removed. Samples were sent to the laboratory as requested by the clinical team. IMPRESSION: Successful ultrasound-guided paracentesis yielding 3.8 liters of peritoneal fluid. Read by: Brayton ElKevin Bruning PA-C Electronically Signed   By: Irish LackGlenn  Yamagata M.D.   On: 03/19/2017 14:32   [place] Paracentesis  Result Date: 03/16/2017 INDICATION: Cirrhosis and ascites. EXAM: ULTRASOUND GUIDED PARACENTESIS MEDICATIONS: None. COMPLICATIONS: None immediate. PROCEDURE: Informed written consent was obtained from the patient after a discussion of the risks, benefits and alternatives to treatment. A timeout was performed prior to the initiation of the procedure. Initial ultrasound scanning was performed to localize ascites. The left lower abdomen was prepped and draped in the usual sterile fashion. 1% lidocaine was used for local anesthesia. Following this, a 6 Fr Safe-T-Centesis catheter was introduced. An ultrasound image was saved for documentation purposes. The paracentesis was performed. The catheter was removed and a dressing was applied. The patient tolerated the procedure well without immediate post procedural complication. FINDINGS:  A total of approximately 4.2 L of yellow fluid was removed. IMPRESSION: Successful ultrasound-guided paracentesis yielding 4.2 liters of peritoneal fluid. Electronically Signed   By: Irish LackGlenn  Yamagata M.D.   On: 03/16/2017 10:46   [place] Abdominal Pelvic Art/vent Flow Doppler  Result Date: 03/18/2017 CLINICAL DATA:  Ascites. EXAM: DUPLEX ULTRASOUND OF LIVER TECHNIQUE: Color and duplex Doppler ultrasound was performed to evaluate the hepatic in-flow and out-flow vessels. COMPARISON:  None. FINDINGS: Portal Vein Velocities Main:  26 cm/sec Right:  7 cm/sec Left:  8 cm/sec Hepatic Vein Velocities Right:  22 cm/sec Middle:  12 cm/sec Left:  6 cm/sec Hepatic Artery Velocity:  260 cm/sec Splenic Vein Velocity:  21 cm/sec Varices: None identified. Ascites: Small to moderate volume ascites present. There is no evidence of portal vein thrombus in the main portal vein appears normal in caliber. Portal vein flow is hepatopetal. Although the main portal vein velocity is not significantly reduced, intrahepatic portal vein velocity does appear to be significantly reduced and elevated hepatic artery velocities support underlying portal hypertension. Portal vein waveforms are somewhat pulsatile in appearance at the level of the main portal vein. However, there does appear to be some likely interference with adjacent hepatic arterial transmission. Intrahepatic portal vein waveforms are not pulsatile. Pulsatile portal flow can be associated with right-sided heart failure, tricuspid regurgitation and cirrhosis with arterioportal shunting. No evidence of splenic vein thrombosis. No evidence of hepatic veno-occlusive disease. Venous waveforms are normal. IMPRESSION: Although main portal vein velocities are normal, significant reduction in intrahepatic portal vein velocities and corresponding high velocity in the hepatic artery are suggestive of some degree of underlying portal hypertension. No evidence of portal vein thrombus, reversed  portal vein flow or portal vein dilatation. Electronically Signed   By: Irish LackGlenn  Yamagata M.D.   On:  03/18/2017 15:28   US Abdomen Limited Ruq  Result Date: 03/08/2017 CLINICAL DATA:  Elevated bilirubin. EXAM: ULTRASOUND ABDOMEN LIMITED RIGHT UPPER QUADRANT COMPARISON:  CT abdomen 01/16/2017. FINDINGS: Gallbladder: Thick-walled gallbladder, up to 4.6 mm, without visible stones or sludge. Negative sonographic Murphy's sign. No pericholecystic fluid. Common bile duct: Diameter: Upper limits normal, 5.5 mm. Liver: Nodular contour of the liver, with increased echotexture, suggesting cirrhosis. No focal lesions. Portal vein is patent on color Doppler imaging with normal direction of blood flow towards the liver. Incidental note is made of moderate ascites. IMPRESSION: Findings consistent with cirrhosis of the liver, with probable secondary ascites. No gallstones, but thick-walled gallbladder, almost 5 mm. No biliary ductal dilatation. Electronically Signed   By: Elsie Stain M.D.   On: 03/08/2017 18:48     CBC Recent Labs  Lab 03/23/17 0348  WBC 7.6  HGB 12.4*  HCT 35.8*  PLT 201  MCV 99.4  MCH 34.5*  MCHC 34.7  RDW 15.6*    Chemistries  Recent Labs  Lab 03/20/17 0403 03/21/17 0313 03/22/17 0308 03/23/17 1129 03/24/17 0352  NA 131* 129* 129* 131* 131*  K 4.1 4.3 3.9 4.1 4.2  CL 100* 97* 98* 97* 96*  CO2 23 25 24 27 25   GLUCOSE 101* 98 90 127* 129*  BUN 11 9 9 12 15   CREATININE 0.80 0.84 0.85 0.81 0.83  CALCIUM 8.7* 8.8* 8.5* 8.5* 8.9  AST  --  72*  --   --   --   ALT  --  33  --   --   --   ALKPHOS  --  694*  --   --   --   BILITOT  --  7.3*  --   --   --    ------------------------------------------------------------------------------------------------------------------ estimated creatinine clearance is 96.5 mL/min (by C-G formula based on SCr of 0.83 mg/dL). ------------------------------------------------------------------------------------------------------------------ No  results for input(s): HGBA1C in the last 72 hours. ------------------------------------------------------------------------------------------------------------------ No results for input(s): CHOL, HDL, LDLCALC, TRIG, CHOLHDL, LDLDIRECT in the last 72 hours. ------------------------------------------------------------------------------------------------------------------ No results for input(s): TSH, T4TOTAL, T3FREE, THYROIDAB in the last 72 hours.  Invalid input(s): FREET3 ------------------------------------------------------------------------------------------------------------------ No results for input(s): VITAMINB12, FOLATE, FERRITIN, TIBC, IRON, RETICCTPCT in the last 72 hours.  Coagulation profile No results for input(s): INR, PROTIME in the last 168 hours.  No results for input(s): DDIMER in the last 72 hours.  Cardiac Enzymes No results for input(s): CKMB, TROPONINI, MYOGLOBIN in the last 168 hours.  Invalid input(s): CK ------------------------------------------------------------------------------------------------------------------ Invalid input(s): POCBNP    Assessment & Plan  Patient is a 61 year old with liver cirrhosis with ascites   IMPRESSION AND PLAN: 1  spontaneous bacterial peritonitis: On IV Rocephin, no fever, acetic fluid cultures are negative so far.  Negative.  Status post paracentesis again yesterday, removed 3 L of fluid. On IV Rocephin.  Continue IV Lasix, seen by gastroenterology, continue antibiotics   .  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  Changed to p.o. Lasix tomorrow.  Patient has had extensive workup for his liver cirrhosis in the past Watch for renal function, use IV albumin if needed.   3 .chronic BPH, urinary retention now.  Foley catheter is inserted.  Having good urine output.   4 chronic benign essential  hypertension';   Hypotension ,continue  midodrine, continue IV Lasix, Aldactone,   5.  Hyponatremia related to liver cirrhosis and ascites; continue to follow ,  improved sodium.   #6 upper endoscopy as an outpatient for esophageal viruses.  #6 constipation: Continue stool softeners. #7 deconditioning: Physical therapy evaluation today.     Code Status Orders  (From admission, onward)        Start     Ordered   03/17/17 2333  Full code  Continuous     03/17/17 2332    Code Status History    Date Active Date Inactive Code Status Order ID Comments User Context   03/08/2017 23:25 03/09/2017 19:45 Full Code [id number]  Oralia Manis, MD Inpatient           Consults neurology   DVT Prophylaxis SCDs Lab Results  Component Value Date   PLT 201 03/23/2017     Time Spent in minutes 32 minutes  Greater than 50% of time spent in care coordination and counseling patient regarding the condition and plan of care.   Katha Hamming M.D on 03/25/2017 at 10:10 AM  Between 7am to 6pm - Pager - [phone]  After 6pm go to [email or web address] - password [email or web address] [facility]  [facility]   Office  [phone]

## 2017-03-26 NOTE — Progress Notes (Signed)
[employer] - [place] at [facility]                                                                                                                                                                                  Patient Demographics   Patrick [name], is a 61 y.o. male, DOB - Oct 06, 1955, WUJ:[id number]  Admit date - 03/17/2017   Admitting Physician Bertrum Sol, MD  Outpatient Primary MD for the patient is [facility]   LOS - 9  Subjective: Sitting in the chair, has some abdominal pain 6 out of 10 in severity. Review of Systems:   CONSTITUTIONAL: No documented fever. No fatigue, weakness. No weight gain, no weight loss.  EYES: No blurry or double vision.  ENT: No tinnitus. No postnasal drip. No redness of the oropharynx.  RESPIRATORY: No cough, no wheeze, no hemoptysis. No dyspnea.  CARDIOVASCULAR: No chest pain. No orthopnea. No palpitations. No syncope.  GASTROINTESTINAL:, No vomiting, decreased abdominal pain, decreased ascites. GENITOURINARY: No dysuria or hematuria.  ENDOCRINE: No polyuria or nocturia. No heat or cold intolerance.  HEMATOLOGY: No anemia. No bruising. No bleeding.  INTEGUMENTARY: No rashes. No lesions.  MUSCULOSKELETAL: No arthritis. No swelling. No gout.  NEUROLOGIC: No numbness, tingling, or ataxia. No seizure-type activity.  PSYCHIATRIC: No anxiety. No insomnia. No ADD.    Vitals:   Vitals:   03/25/17 1955 03/26/17 0502 03/26/17 0759 03/26/17 1038  BP: 116/60 97/63 (!) 94/51 (!) 116/56  Pulse: 92 88 83   Resp: 16 16    Temp: 98.6 F (37 C) 98.1 F (36.7 C)    TempSrc: Oral Oral    SpO2: 100% 92%    Weight:  73 kg (161 lb)    Height:        Wt Readings from Last 3 Encounters:  03/26/17 73 kg (161 lb)  03/08/17 80 kg (176 lb 6.4 oz)  03/08/17 74.4 kg (164 lb)     Intake/Output Summary (Last 24 hours) at 03/26/2017 1211 Last data filed at 03/26/2017 1019 Gross per 24 hour  Intake 600 ml  Output 1325 ml   Net -725 ml    Physical Exam:   GENERAL: Pleasant-appearing in no apparent distress.  HEAD, EYES, EARS, NOSE AND THROAT: Atraumatic, normocephalic. Extraocular muscles are intact. Pupils equal and reactive to light. Sclerae anicteric. No conjunctival injection. No oro-pharyngeal erythema.  NECK: Supple. There is no jugular venous distention. No bruits, no lymphadenopathy, no thyromegaly.  HEART: Regular rate and rhythm,. No murmurs, no rubs, no clicks.  LUNGS: Clear to auscultation bilaterally. No rales or rhonchi. No wheezes.  ABDOMEN: Bowel sounds present, distended .Marland Kitchen  Ascites present.slight tenderness EXTREMITIES:  No evidence of any cyanosis, clubbing, or peripheral edema.  +2 pedal and radial pulses bilaterally.  NEUROLOGIC: The patient is alert, awake, and oriented x3 with no focal motor or sensory deficits appreciated bilaterally. Some cognitive deficit. SKIN: Moist and warm with no rashes appreciated.  Psych: Not anxious, depressed LN: No inguinal LN enlargement    Antibiotics   Anti-infectives (From admission, onward)   Start     Dose/Rate Route Frequency Ordered Stop   03/17/17 2345  ceFEPIme (MAXIPIME) 2 g in dextrose 5 % 50 mL IVPB  Status:  Discontinued     2 g 100 mL/hr over 30 Minutes Intravenous Every 8 hours 03/17/17 2332 03/17/17 2353   03/17/17 1830  cefTRIAXone (ROCEPHIN) 2 g in dextrose 5 % 50 mL IVPB     2 g 100 mL/hr over 30 Minutes Intravenous Every 24 hours 03/17/17 1816        Medications   Scheduled Meds: . feeding supplement (ENSURE ENLIVE)  237 mL Oral TID BM  . finasteride  5 mg Oral Daily  . furosemide  40 mg Intravenous Q12H  . heparin  5,000 Units Subcutaneous Q8H  . lactulose  30 g Oral Daily  . midodrine  5 mg Oral TID WC  . multivitamin with minerals  1 tablet Oral Daily  . pantoprazole  40 mg Oral Daily  . sodium chloride flush  3 mL Intravenous Q12H  . spironolactone  50 mg Oral Daily  . tamsulosin  0.4 mg Oral QPC breakfast    Continuous Infusions: . sodium chloride    . cefTRIAXone (ROCEPHIN)  IV 2 g (03/25/17 1802)   PRN Meds:.sodium chloride, acetaminophen **OR** acetaminophen, HYDROcodone-acetaminophen, ondansetron **OR** ondansetron (ZOFRAN) IV, polyethylene glycol, prochlorperazine, sodium chloride flush   Data Review:   Micro Results Recent Results (from the past 240 hour(s))  CULTURE, BLOOD (ROUTINE X 2) w Reflex to ID Panel     Status: None   Collection Time: 03/17/17 11:43 PM  Result Value Ref Range Status   Specimen Description BLOOD RIGHT ANTECUBITAL  Final   Special Requests   Final    BOTTLES DRAWN AEROBIC AND ANAEROBIC Blood Culture adequate volume   Culture   Final    NO GROWTH 5 DAYS Performed at [facility], [address] [place], [place] [address]    Report Status 03/23/2017 FINAL  Final  CULTURE, BLOOD (ROUTINE X 2) w Reflex to ID Panel     Status: None   Collection Time: 03/17/17 11:44 PM  Result Value Ref Range Status   Specimen Description BLOOD RIGHT ANTECUBITAL  Final   Special Requests   Final    BOTTLES DRAWN AEROBIC AND ANAEROBIC Blood Culture adequate volume   Culture   Final    NO GROWTH 5 DAYS Performed at [facility], [address] [place], [place] [address]    Report Status 03/23/2017 FINAL  Final  Aerobic/Anaerobic Culture (surgical/deep wound)     Status: None   Collection Time: 03/19/17  1:00 PM  Result Value Ref Range Status   Specimen Description   Final    PERITONEAL Performed at [facility], [address] [place], [place] [address]    Special Requests   Final    NONE Performed at [facility], [address] [place], [place] [address]    Gram Stain   Final    FEW WBC PRESENT,BOTH PMN AND MONONUCLEAR NO ORGANISMS SEEN    Culture   Final  No growth aerobically or anaerobically. Performed at [facility], [address] [place], [place] [address]    Report Status 03/24/2017 FINAL  Final     Radiology Reports Ct Abdomen Pelvis W Contrast  Result Date: 03/20/2017 CLINICAL DATA:  No surgery. NKI. Hx of liver cirrhosis. Pt c/o abdominal pain and distention since 12/21. Best images possible due to pt ability to hold breath. EXAM: CT ABDOMEN AND PELVIS WITH CONTRAST TECHNIQUE: Multidetector CT imaging of the abdomen and pelvis was performed using the standard protocol following bolus administration of intravenous contrast. CONTRAST:  75mL ISOVUE-370 IOPAMIDOL (ISOVUE-370) INJECTION 76% COMPARISON:  01/14/2017 FINDINGS: Lower chest: Dependent atelectasis. No acute findings. Heart normal in size. Hepatobiliary: The liver top-normal in size. There is heterogeneous attenuation/enhancement. No discrete mass or focal lesion. There is decreased attenuation diffusely consistent with fatty infiltration. Gallbladder is unremarkable. No bile duct dilation. Pancreas: Unremarkable. No pancreatic ductal dilatation or surrounding inflammatory changes. Spleen: Normal in size without focal abnormality. Adrenals/Urinary Tract: No adrenal masses. Kidneys are normal size, orientation and position. No renal masses or stones. No hydronephrosis. Normal ureters. Bladder is thick walled. There is a left posterior diverticulum and a superior diverticulum. No discrete bladder mass. No stone. Bladder appearance is stable from the prior CT. Stomach/Bowel: No bowel dilation to suggest obstruction. There is no wall thickening or convincing inflammation. Small hiatal hernia. Stomach otherwise unremarkable. Normal appendix visualized. Vascular/Lymphatic: Atherosclerotic disease is noted throughout a normal caliber abdominal aorta extending into the iliac vessels. There are vascular collaterals in the upper abdomen with a patent paraumbilical vein and collaterals along the lesser curvature of the stomach. These findings are stable from the prior CT. Reproductive: Prostate is enlarged bulging into the bladder base, unchanged from  the prior CT. Other: Moderate ascites distends the abdomen increased from the prior CT. Musculoskeletal: No fracture or acute finding. No osteoblastic or osteolytic lesions. IMPRESSION: 1. Moderate ascites distends the abdomen. This has increased when compared to the prior CT, and accounts for abdominal distention. 2. Findings consistent with cirrhosis, with superimposed hepatic steatosis, and portal venous hypertension reflected by venous collaterals and ascites. Liver appearance is stable from the prior study. 3. Thick walled bladder with diverticulum, along with an enlarged prostate. This is stable from the prior CT and suggests chronic bladder outlet obstruction. 4. Aortic atherosclerosis. Electronically Signed   By: Amie Portland M.D.   On: 03/20/2017 14:01   Mr Liver W Wo Contrast  Result Date: 03/09/2017 CLINICAL DATA:  Abnormal liver function tests. Recently diagnosed with cirrhosis. Recent paracentesis. EXAM: MRI ABDOMEN WITHOUT AND WITH CONTRAST TECHNIQUE: Multiplanar multisequence MR imaging of the abdomen was performed both before and after the administration of intravenous contrast. CONTRAST:  15 cc MultiHance COMPARISON:  03/08/2017 abdominal ultrasound.  01/16/2017 CT FINDINGS: Mild to moderate motion degradation throughout. Lower chest: Normal heart size without pericardial or pleural effusion. Hepatobiliary: Hepatomegaly, 20.0 cm craniocaudal. No significant steatosis. No definite evidence of cirrhosis. Normal gallbladder, without biliary ductal dilatation. Pancreas:  Normal, without mass or ductal dilatation. Spleen:  Normal in size, without focal abnormality. Adrenals/Urinary Tract: Normal adrenal glands. Normal kidneys, without hydronephrosis. Stomach/Bowel: Normal stomach and abdominal bowel loops. Vascular/Lymphatic: Aortic and branch vessel atherosclerosis. Patent portal and splenic veins. Other: Small volume ascites, relatively similar to 01/16/2017. Anasarca. Musculoskeletal: No acute  osseous abnormality. IMPRESSION: 1. Motion degradation. 2. Hepatomegaly. No definite explanation for elevated liver function tests. 3. Small volume ascites, similar. Electronically Signed   By: Hosie Spangle.D.  On: 03/09/2017 12:10   US Abdomen Limited  Result Date: 03/18/2017 CLINICAL DATA:  Cirrhosis. Ascites. Last paracentesis of 03/16/2017. EXAM: LIMITED ABDOMEN ULTRASOUND FOR ASCITES TECHNIQUE: Limited ultrasound survey for ascites was performed in all four abdominal quadrants. COMPARISON:  03/16/2017 FINDINGS: Moderate ascites distends the abdomen, noted in all 4 quadrants. IMPRESSION: Moderate ascites throughout the abdomen. Electronically Signed   By: Amie Portland M.D.   On: 03/18/2017 09:42   US Paracentesis  Result Date: 03/24/2017 INDICATION: History of cirrhosis, now with recurrent symptomatic ascites. EXAM: ULTRASOUND-GUIDED PARACENTESIS COMPARISON:  Ultrasound-guided paracentesis- 03/19/2017; 03/16/2017 MEDICATIONS: None. COMPLICATIONS: None immediate. TECHNIQUE: Informed written consent was obtained from the patient after a discussion of the risks, benefits and alternatives to treatment. A timeout was performed prior to the initiation of the procedure. Initial ultrasound scanning demonstrates a moderate amount of ascites within the right lower abdominal quadrant. The right lower abdomen was prepped and draped in the usual sterile fashion. 1% lidocaine with epinephrine was used for local anesthesia. An ultrasound image was saved for documentation purposed. An 8 Fr Safe-T-Centesis catheter was introduced. The paracentesis was performed. The catheter was removed and a dressing was applied. The patient tolerated the procedure well without immediate post procedural complication. FINDINGS: A total of approximately 3.5 liters of serous fluid was removed. IMPRESSION: Successful ultrasound-guided paracentesis yielding 3.5 liters of peritoneal fluid. Electronically Signed   By: Simonne Come M.D.    On: 03/24/2017 14:30   US Paracentesis  Result Date: 03/19/2017 INDICATION: Abdominal distention. Recurrent ascites. Request diagnostic therapeutic paracentesis. EXAM: ULTRASOUND GUIDED RIGHT LOWER QUADRANT PARACENTESIS MEDICATIONS: None. COMPLICATIONS: None immediate. PROCEDURE: Informed written consent was obtained from the patient after a discussion of the risks, benefits and alternatives to treatment. A timeout was performed prior to the initiation of the procedure. Initial ultrasound scanning demonstrates a large amount of ascites within the right lower abdominal quadrant. The right lower abdomen was prepped and draped in the usual sterile fashion. 1% lidocaine with epinephrine was used for local anesthesia. Following this, a Safe-T-Centesis catheter was introduced. An ultrasound image was saved for documentation purposes. The paracentesis was performed. The catheter was removed and a dressing was applied. The patient tolerated the procedure well without immediate post procedural complication. FINDINGS: A total of approximately 3.8 L of clear yellow fluid was removed. Samples were sent to the laboratory as requested by the clinical team. IMPRESSION: Successful ultrasound-guided paracentesis yielding 3.8 liters of peritoneal fluid. Read by: Brayton El PA-C Electronically Signed   By: Irish Lack M.D.   On: 03/19/2017 14:32   US Paracentesis  Result Date: 03/16/2017 INDICATION: Cirrhosis and ascites. EXAM: ULTRASOUND GUIDED PARACENTESIS MEDICATIONS: None. COMPLICATIONS: None immediate. PROCEDURE: Informed written consent was obtained from the patient after a discussion of the risks, benefits and alternatives to treatment. A timeout was performed prior to the initiation of the procedure. Initial ultrasound scanning was performed to localize ascites. The left lower abdomen was prepped and draped in the usual sterile fashion. 1% lidocaine was used for local anesthesia. Following this, a 6 Fr  Safe-T-Centesis catheter was introduced. An ultrasound image was saved for documentation purposes. The paracentesis was performed. The catheter was removed and a dressing was applied. The patient tolerated the procedure well without immediate post procedural complication. FINDINGS: A total of approximately 4.2 L of yellow fluid was removed. IMPRESSION: Successful ultrasound-guided paracentesis yielding 4.2 liters of peritoneal fluid. Electronically Signed   By: Irish Lack M.D.   On: 03/16/2017 10:46   US  Abdominal Pelvic Art/vent Flow Doppler  Result Date: 03/18/2017 CLINICAL DATA:  Ascites. EXAM: DUPLEX ULTRASOUND OF LIVER TECHNIQUE: Color and duplex Doppler ultrasound was performed to evaluate the hepatic in-flow and out-flow vessels. COMPARISON:  None. FINDINGS: Portal Vein Velocities Main:  26 cm/sec Right:  7 cm/sec Left:  8 cm/sec Hepatic Vein Velocities Right:  22 cm/sec Middle:  12 cm/sec Left:  6 cm/sec Hepatic Artery Velocity:  260 cm/sec Splenic Vein Velocity:  21 cm/sec Varices: None identified. Ascites: Small to moderate volume ascites present. There is no evidence of portal vein thrombus in the main portal vein appears normal in caliber. Portal vein flow is hepatopetal. Although the main portal vein velocity is not significantly reduced, intrahepatic portal vein velocity does appear to be significantly reduced and elevated hepatic artery velocities support underlying portal hypertension. Portal vein waveforms are somewhat pulsatile in appearance at the level of the main portal vein. However, there does appear to be some likely interference with adjacent hepatic arterial transmission. Intrahepatic portal vein waveforms are not pulsatile. Pulsatile portal flow can be associated with right-sided heart failure, tricuspid regurgitation and cirrhosis with arterioportal shunting. No evidence of splenic vein thrombosis. No evidence of hepatic veno-occlusive disease. Venous waveforms are normal.  IMPRESSION: Although main portal vein velocities are normal, significant reduction in intrahepatic portal vein velocities and corresponding high velocity in the hepatic artery are suggestive of some degree of underlying portal hypertension. No evidence of portal vein thrombus, reversed portal vein flow or portal vein dilatation. Electronically Signed   By: Irish LackGlenn  Yamagata M.D.   On: 03/18/2017 15:28   [place] Abdomen Limited Ruq  Result Date: 03/08/2017 CLINICAL DATA:  Elevated bilirubin. EXAM: ULTRASOUND ABDOMEN LIMITED RIGHT UPPER QUADRANT COMPARISON:  CT abdomen 01/16/2017. FINDINGS: Gallbladder: Thick-walled gallbladder, up to 4.6 mm, without visible stones or sludge. Negative sonographic Murphy's sign. No pericholecystic fluid. Common bile duct: Diameter: Upper limits normal, 5.5 mm. Liver: Nodular contour of the liver, with increased echotexture, suggesting cirrhosis. No focal lesions. Portal vein is patent on color Doppler imaging with normal direction of blood flow towards the liver. Incidental note is made of moderate ascites. IMPRESSION: Findings consistent with cirrhosis of the liver, with probable secondary ascites. No gallstones, but thick-walled gallbladder, almost 5 mm. No biliary ductal dilatation. Electronically Signed   By: Elsie StainJohn T Curnes M.D.   On: 03/08/2017 18:48     CBC Recent Labs  Lab 03/23/17 0348  WBC 7.6  HGB 12.4*  HCT 35.8*  PLT 201  MCV 99.4  MCH 34.5*  MCHC 34.7  RDW 15.6*    Chemistries  Recent Labs  Lab 03/20/17 0403 03/21/17 0313 03/22/17 0308 03/23/17 1129 03/24/17 0352  NA 131* 129* 129* 131* 131*  K 4.1 4.3 3.9 4.1 4.2  CL 100* 97* 98* 97* 96*  CO2 23 25 24 27 25   GLUCOSE 101* 98 90 127* 129*  BUN 11 9 9 12 15   CREATININE 0.80 0.84 0.85 0.81 0.83  CALCIUM 8.7* 8.8* 8.5* 8.5* 8.9  AST  --  72*  --   --   --   ALT  --  33  --   --   --   ALKPHOS  --  694*  --   --   --   BILITOT  --  7.3*  --   --   --     ------------------------------------------------------------------------------------------------------------------ estimated creatinine clearance is 96.5 mL/min (by C-G formula based on SCr of 0.83 mg/dL). ------------------------------------------------------------------------------------------------------------------ No results for input(s): HGBA1C in  the last 72 hours. ------------------------------------------------------------------------------------------------------------------ No results for input(s): CHOL, HDL, LDLCALC, TRIG, CHOLHDL, LDLDIRECT in the last 72 hours. ------------------------------------------------------------------------------------------------------------------ No results for input(s): TSH, T4TOTAL, T3FREE, THYROIDAB in the last 72 hours.  Invalid input(s): FREET3 ------------------------------------------------------------------------------------------------------------------ No results for input(s): VITAMINB12, FOLATE, FERRITIN, TIBC, IRON, RETICCTPCT in the last 72 hours.  Coagulation profile No results for input(s): INR, PROTIME in the last 168 hours.  No results for input(s): DDIMER in the last 72 hours.  Cardiac Enzymes No results for input(s): CKMB, TROPONINI, MYOGLOBIN in the last 168 hours.  Invalid input(s): CK ------------------------------------------------------------------------------------------------------------------ Invalid input(s): POCBNP    Assessment & Plan  Patient is a 61 year old with liver cirrhosis with ascites   IMPRESSION AND PLAN: 1  spontaneous bacterial peritonitis: On IV Rocephin, no fever, acetic fluid cultures are negative so far.  Negative.  Status post paracentesis again on 12/19, removed 3 L of fluid. On IV Rocephin.  Continue IV Lasix, seen by gastroenterology, continue antibiotics   .  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    Patient has had extensive workup for his liver cirrhosis in the past Watch for renal function, use IV albumin if needed.   3 .chronic BPH, urinary retention now.  Foley catheter is inserted.  Having good urine output.   4 chronic benign essential hypertension';   Hypotension ,continue  midodrine, continue IV Lasix, Aldactone,   5.  Hyponatremia related to liver cirrhosis and ascites; continue to follow , improved sodium.   #6 upper endoscopy as an outpatient for esophageal viruses.  #6 constipation: Continue stool softeners. #7 deconditioning: Physical therapy recommended home health physical therapy.. likely Discharge tomorrow.     Code Status Orders  (From admission, onward)        Start     Ordered   03/17/17 2333  Full code  Continuous     03/17/17 2332    Code Status History    Date Active Date Inactive Code Status Order ID Comments User Context   03/08/2017 23:25 03/09/2017 19:45 Full Code [id number]  Oralia Manis, MD Inpatient           Consults neurology   DVT Prophylaxis SCDs Lab Results  Component Value Date   PLT 201 03/23/2017     Time Spent in minutes 32 minutes  Greater than 50% of time spent in care coordination and counseling patient regarding the condition and plan of care.   Katha Hamming M.D on 03/26/2017 at 12:11 PM  Between 7am to 6pm - Pager - [phone]  After 6pm go to [email or web address] - password [email or web address] [facility]  [facility]   Office  [phone]

## 2017-03-26 NOTE — Evaluation (Signed)
Physical Therapy Evaluation Patient Details Name: Patrick LigasWeldon [name] MRN: [id number] DOB: [birth year] Today's Date: 03/26/2017   History of Present Illness  Pt is a 61 y.o. male presenting to hospital 03/17/17 with increased abdominal pain, chills, and nausea.  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  Pt s/p 3 paracentesis during admission.  PMH includes depression, htn, tuberculosis, alcoholic liver cirrhosis with ascites, and h/o L 2nd toe metatarsal fx.  Clinical Impression  Prior to hospital admission, pt reports being independent.  Pt lives alone in 1 level home.  Currently pt is modified independent with bed mobility, independent with tranfers, and CGA ambulating (no AD) around nursing loop.  No loss of balance noted with ambulation/functional activities during session but higher level balance deficits noted.  Pt very HOH but demonstrated (and repeated information when requested) appropriate safety/functional mobility during session.  Pt would benefit from skilled PT to address noted impairments and functional limitations (see below for any additional details).  Upon hospital discharge, recommend pt discharge to home with HHPT.    Follow Up Recommendations Home health PT    Equipment Recommendations  None recommended by PT    Recommendations for Other Services       Precautions / Restrictions Precautions Precautions: Fall Precaution Comments: HOH Restrictions Weight Bearing Restrictions: No      Mobility  Bed Mobility Overal bed mobility: Modified Independent             General bed mobility comments: Supine to sit with HOB elevated without any difficulties.  Transfers Overall transfer level: Independent Equipment used: None             General transfer comment: Sit to/from stand steady  Ambulation/Gait Ambulation/Gait assistance: Min guard Ambulation Distance (Feet): 200 Feet Assistive device: None   Gait velocity: decreased   General Gait Details: decreased B step length; intermittent increased lateral sway but no loss of balance noted; pt initially using railing in hallway for steadiness but after 20 feet stopped using with vc's  Stairs Stairs: (Pt reports level entry into home.)          Wheelchair Mobility    Modified Rankin (Stroke Patients Only)       Balance Overall balance assessment: Needs assistance Sitting-balance support: No upper extremity supported;Feet supported Sitting balance-Leahy Scale: Normal Sitting balance - Comments: steady sitting reaching outside BOS   Standing balance support: No upper extremity supported Standing balance-Leahy Scale: Good Standing balance comment: pt hesistant to reach outside BOS (preferring to hold on when balance challenged)                             Pertinent Vitals/Pain Pain Assessment: 0-10 Pain Score: 8  Pain Location: stomache Pain Descriptors / Indicators: Tightness;Tender;Sore Pain Intervention(s): Limited activity within patient's tolerance;Monitored during session;Repositioned(Pt reports recent pain meds; nursing notified regarding pt's pain)  BP 116/56 during session.  HR and O2 (on room air) WFL.    Home Living Family/patient expects to be discharged to:: Private residence Living Arrangements: Alone Available Help at Discharge: Family Type of Home: House Home Access: Level entry     Home Layout: One level Home Equipment: None      Prior Function Level of Independence: Independent         Comments: Pt denies any falls in past 6 months.     Hand Dominance        Extremity/Trunk Assessment  Upper Extremity Assessment Upper Extremity Assessment: Generalized weakness    Lower Extremity Assessment Lower Extremity Assessment: Generalized weakness    Cervical / Trunk Assessment Cervical / Trunk Assessment: Normal  Communication   Communication: HOH  Cognition  Arousal/Alertness: Awake/alert Behavior During Therapy: WFL for tasks assessed/performed Overall Cognitive Status: Within Functional Limits for tasks assessed                                        General Comments General comments (skin integrity, edema, etc.): Pt resting in bed upon PT arrival; foley catheter in place.  Nursing cleared pt for participation in physical therapy.  Pt agreeable to PT session.    Exercises     Assessment/Plan    PT Assessment Patient needs continued PT services  PT Problem List Decreased strength;Decreased balance;Pain;Decreased mobility       PT Treatment Interventions DME instruction;Gait training;Functional mobility training;Therapeutic activities;Therapeutic exercise;Balance training;Patient/family education    PT Goals (Current goals can be found in the Care Plan section)  Acute Rehab PT Goals Patient Stated Goal: to go home PT Goal Formulation: With patient Time For Goal Achievement: 04/09/17 Potential to Achieve Goals: Good    Frequency Min 2X/week   Barriers to discharge        Co-evaluation               AM-PAC PT "6 Clicks" Daily Activity  Outcome Measure Difficulty turning over in bed (including adjusting bedclothes, sheets and blankets)?: None Difficulty moving from lying on back to sitting on the side of the bed? : None Difficulty sitting down on and standing up from a chair with arms (e.g., wheelchair, bedside commode, etc,.)?: None Help needed moving to and from a bed to chair (including a wheelchair)?: None Help needed walking in hospital room?: A Little Help needed climbing 3-5 steps with a railing? : A Little 6 Click Score: 22    End of Session Equipment Utilized During Treatment: Gait belt Activity Tolerance: Patient tolerated treatment well Patient left: in chair;with call bell/phone within reach;with chair alarm set;with nursing/sitter in room Nurse Communication: Mobility  status;Precautions;Other (comment)(Pt's pain status) PT Visit Diagnosis: Other abnormalities of gait and mobility (R26.89);Muscle weakness (generalized) (M62.81)    Time: 1010-1038 PT Time Calculation (min) (ACUTE ONLY): 28 min   Charges:   PT Evaluation $PT Eval Low Complexity: 1 Low PT Treatments $Therapeutic Activity: 8-22 mins   PT G Codes:   PT G-Codes **NOT FOR INPATIENT CLASS** Functional Assessment Tool Used: AM-PAC 6 Clicks Basic Mobility Functional Limitation: Mobility: Walking and moving around Mobility: Walking and Moving Around Current Status ([id number]([id number]): At least 20 percent but less than 40 percent impaired, limited or restricted Mobility: Walking and Moving Around Goal Status [phone]([id number]): 0 percent impaired, limited or restricted    Hendricks Limesmily Alayzia Pavlock, PT 03/26/17, 10:51 AM [phone]

## 2017-03-27 MED ORDER — ENSURE ENLIVE PO LIQD: 237.0000 mL | Freq: Three times a day (TID) | ORAL | 12 refills | Status: AC

## 2017-03-27 MED ORDER — MIDODRINE HCL 5 MG PO TABS: 5.0000 mg | ORAL_TABLET | Freq: Three times a day (TID) | ORAL | 0 refills | Status: AC

## 2017-03-27 MED ORDER — FUROSEMIDE 20 MG PO TABS: 40.0000 mg | ORAL_TABLET | Freq: Two times a day (BID) | ORAL | 1 refills | Status: AC

## 2017-03-27 MED ORDER — CIPROFLOXACIN HCL 500 MG PO TABS: 500.0000 mg | ORAL_TABLET | Freq: Every day | ORAL | 0 refills | Status: AC

## 2017-03-27 NOTE — Progress Notes (Signed)
Pt discharged today per MD order. IV removed. Home health plan discussed with pt. Discharge instructions reviewed with pt. Questions answered to satisfaction. Pt taken to car in wheelchair by staff.

## 2017-03-27 NOTE — Progress Notes (Signed)
Physical Therapy Treatment Patient Details Name: Patrick [name] MRN: [id number] DOB: [birth year] Today's Date: 03/27/2017    History of Present Illness Pt is a 61 y.o. male presenting to hospital 03/17/17 with increased abdominal pain, chills, and nausea.  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  Pt s/p 3 paracentesis during admission.  PMH includes depression, htn, tuberculosis, alcoholic liver cirrhosis with ascites, and h/o L 2nd toe metatarsal fx.    PT Comments    Pt tolerated standing ex's and ambulating around nursing loop well today.  Higher level balance deficits noted but no loss of balance during session with functional mobility (decreased gait velocity noted but mildly increased compared to yesterday).  Will continue to focus on higher level balance activities and independence with functional mobility during hospital stay.   Follow Up Recommendations  Home health PT     Equipment Recommendations  None recommended by PT    Recommendations for Other Services       Precautions / Restrictions Precautions Precautions: Fall Precaution Comments: HOH Restrictions Weight Bearing Restrictions: No    Mobility  Bed Mobility Overal bed mobility: Modified Independent             General bed mobility comments: Supine to/from sit with HOB elevated without any difficulties.  Transfers Overall transfer level: Independent Equipment used: None             General transfer comment: Sit to/from stand steady  Ambulation/Gait Ambulation/Gait assistance: Supervision Ambulation Distance (Feet): 200 Feet Assistive device: None   Gait velocity: decreased (although increased from yesterday)   General Gait Details: decreased B step length; intermittent increased lateral sway but no loss of balance noted   Stairs            Wheelchair Mobility    Modified Rankin (Stroke Patients Only)        Balance Overall balance assessment: Needs assistance Sitting-balance support: No upper extremity supported;Feet supported Sitting balance-Leahy Scale: Normal Sitting balance - Comments: steady sitting reaching outside BOS   Standing balance support: No upper extremity supported Standing balance-Leahy Scale: Good Standing balance comment: pt still hesistant to reach outside BOS (prefers to hold on when balance challenged)                            Cognition Arousal/Alertness: Awake/alert Behavior During Therapy: WFL for tasks assessed/performed Overall Cognitive Status: Within Functional Limits for tasks assessed                                        Exercises General Exercises - Lower Extremity Hip ABduction/ADduction: AROM;Strengthening;Both;10 reps;Standing(B UE support on counter) Hip Flexion/Marching: AROM;Strengthening;Both;10 reps;Standing(B UE support on counter) Heel Raises: AROM;Strengthening;Both;10 reps;Standing(B UE support on counter)    General Comments General comments (skin integrity, edema, etc.): Pt resting in bed upon PT arrival.  Nursing cleared pt for participation in physical therapy.  Pt agreeable to PT session.      Pertinent Vitals/Pain Pain Assessment: 0-10 Pain Score: 4  Pain Location: stomache Pain Descriptors / Indicators: Tightness;Tender;Sore Pain Intervention(s): Limited activity within patient's tolerance;Monitored during session;Repositioned(Pt declined pain meds)  Vitals (HR and O2 on room air) stable and WFL throughout treatment session.    Home Living  Prior Function            PT Goals (current goals can now be found in the care plan section) Acute Rehab PT Goals Patient Stated Goal: to go home PT Goal Formulation: With patient Time For Goal Achievement: 04/09/17 Potential to Achieve Goals: Good Additional Goals Additional Goal #1: Perform objective balance  assessment. Progress towards PT goals: Progressing toward goals    Frequency    Min 2X/week      PT Plan Current plan remains appropriate    Co-evaluation              AM-PAC PT "6 Clicks" Daily Activity  Outcome Measure  Difficulty turning over in bed (including adjusting bedclothes, sheets and blankets)?: None Difficulty moving from lying on back to sitting on the side of the bed? : None Difficulty sitting down on and standing up from a chair with arms (e.g., wheelchair, bedside commode, etc,.)?: None Help needed moving to and from a bed to chair (including a wheelchair)?: None Help needed walking in hospital room?: A Little Help needed climbing 3-5 steps with a railing? : A Little 6 Click Score: 22    End of Session Equipment Utilized During Treatment: Gait belt Activity Tolerance: Patient tolerated treatment well Patient left: in bed;with call bell/phone within reach;with bed alarm set Nurse Communication: Mobility status;Precautions PT Visit Diagnosis: Other abnormalities of gait and mobility (R26.89);Muscle weakness (generalized) (M62.81)     Time: [id number] PT Time Calculation (min) (ACUTE ONLY): 17 min  Charges:  $Therapeutic Exercise: 8-22 mins                    G CodesHendricks [name]:       Patrick [name], PT 03/27/17, 12:26 PM [phone]

## 2017-03-27 NOTE — Care Management (Signed)
Patient to discharge home today.  Sister to transport.  [facility].  RNCM signing off.

## 2017-03-30 NOTE — Discharge Summary (Signed)
Patrick [name], is a 62 y.o. male  DOB 1955-09-05  MRN [id number].  Admission date:  03/17/2017  Admitting Physician  Bertrum Sol, MD  Discharge Date:  03/30/2017   Primary MD  Center, [facility]  Recommendations for primary care physician for things to follow:   Follow-up with gastroenterology Dr. Wyline Mood in 1 week   Admission Diagnosis  Spontaneous bacterial peritonitis [facility]) [K65.2]   Discharge Diagnosis  Spontaneous bacterial peritonitis (HCC) [K65.2]    Principal Problem:   Cirrhosis of liver (HCC)      Past Medical History:  Diagnosis Date  . Aortic atherosclerosis (HCC) 12/2016  . Arthritis   . BPH (benign prostatic hyperplasia)   . Cirrhosis of liver (HCC)   . Depression   . HTN (hypertension)   . Metatarsal fracture    2nd toe of left foot  . Tuberculosis   . Urinary frequency     Past Surgical History:  Procedure Laterality Date  . NO PAST SURGERIES    . None         History of present illness and  Hospital Course:     Kindly see H&P for history of present illness and admission details, please review complete Labs, Consult reports and Test reports for all details in brief  HPI  from the history and physical done on the day of admission 62 year old male patient patient with alcoholic liver cirrhosis, ascites, paracentesis comes in because of altered mental  status, chills, abdominal pain admitted for presumed SBP.   Hospital Course  #1.  Presumed spontaneous.  Bacterial peritonitis.  Patient received Rocephin.  Peritoneal fluid cultures are negative.  Gastroenterology consulted, Patient received IV antibiotics till day of discharge for SBP, discharged home with Cipro for prophylaxis for  SBP.  Patient seen by gastroenterology they recommended follow-up as an outpatient for  EGD for evaluation of esophageal varices..  Patient did not have any fever, leukocytosis.  High risk mortality in 2 years 70-80% due to liver cirrhosis Patient understands this.  No portal vein thrombosis by on Doppler 12/23. Patient had paracentesis 2 times, this admission.  On December 23 and he had 4.2 L fluid drained from ascites, on December 28 he had 3.2 L fluid drained. . 2.  Alcoholic liver cirrhosis with ascites; patient on Lasix, Aldactone, advised low-salt diet less than 2 g of  sodium per day, because of persistent ascites patient had another paracentesis done by radiology on December 28, 3 and half liters of fluid drained.  After the fluid drainage patient felt better.  Patient high risk for recurrent ascites.  Received IV Lasix in the hospital and then changed to p.o. Lasix.   Patient had normal kidney function.  #3 chronic BPH, patient had urine retention  with more than 500 mL of urine in the bladder so received Foley catheter in the hospital, on the day of discharge patient Foley catheter removed, he voided well without any trouble.  Continue Flomax, Proscar.  #4  4 deconditioning: Physical therapy recommended home health physical therapy. 5.  Psychiatric consult to to see if he is capable of making decisions.  Patient is capable making decisions as per psych evaluation. #6 hyponatremia secondary to liver cirrhosis.  7.  Severe malnutrition in the context of chronic illness.  Seen by dietary, started on Ensure.  Continue that. 8.  Continue lactulose to help with constipation and also to prevent encephalopathy.  Discharge Condition: Stable   Follow UP With gastroenterology doctor.  On the.     Discharge Instructions  and  Discharge Medications      Allergies as of 03/27/2017   No Known Allergies     Medication List    TAKE these medications   ciprofloxacin 500 MG tablet Commonly known as:  CIPRO Take 1 tablet (500 mg total) by mouth daily.   feeding supplement  (ENSURE ENLIVE) Liqd Take 237 mLs by mouth 3 (three) times daily between meals.   finasteride 5 MG tablet Commonly known as:  PROSCAR Take 1 tablet (5 mg total) daily by mouth.   furosemide 20 MG tablet Commonly known as:  LASIX Take 2 tablets (40 mg total) by mouth 2 (two) times daily. What changed:  when to take this   lactulose 10 GM/15ML solution Commonly known as:  CHRONULAC Take 30 g by mouth daily.   midodrine 5 MG tablet Commonly known as:  PROAMATINE Take 1 tablet (5 mg total) by mouth 3 (three) times daily with meals.   pantoprazole 40 MG tablet Commonly known as:  PROTONIX Take 40 mg by mouth daily.   spironolactone 50 MG tablet Commonly known as:  ALDACTONE Take 1 tablet (50 mg total) by mouth daily.   tamsulosin 0.4 MG Caps capsule Commonly known as:  FLOMAX Take 1 capsule (0.4 mg total) daily after breakfast by mouth.         Diet and Activity recommendation: See Discharge Instructions above   Consults obtained -gastroenterology, psychiatry, physical therapy   Major procedures and Radiology Reports - PLEASE review detailed and final reports for all details, in brief -     Ct Abdomen Pelvis W Contrast  Result Date: 03/20/2017 CLINICAL DATA:  No surgery. NKI. Hx of liver cirrhosis. Pt c/o abdominal pain and distention since 12/21. Best images possible due to pt ability to hold breath. EXAM: CT ABDOMEN AND PELVIS WITH CONTRAST TECHNIQUE: Multidetector CT imaging of the abdomen and pelvis was performed using the standard protocol following bolus administration of intravenous contrast. CONTRAST:  75mL ISOVUE-370 IOPAMIDOL (ISOVUE-370) INJECTION 76% COMPARISON:  01/14/2017 FINDINGS: Lower chest: Dependent atelectasis. No acute findings. Heart normal in size. Hepatobiliary: The liver top-normal in size. There is heterogeneous attenuation/enhancement. No discrete mass or focal lesion. There is decreased attenuation diffusely consistent with fatty infiltration.  Gallbladder is unremarkable. No bile duct dilation. Pancreas: Unremarkable. No pancreatic ductal dilatation or surrounding inflammatory changes. Spleen: Normal in size without focal abnormality. Adrenals/Urinary Tract: No adrenal masses. Kidneys are normal size, orientation and position. No renal masses or stones. No hydronephrosis. Normal ureters. Bladder is thick walled. There is a left posterior diverticulum and a superior diverticulum. No discrete bladder mass. No stone. Bladder appearance is stable from the prior CT. Stomach/Bowel: No bowel dilation to suggest obstruction. There is no wall thickening or convincing inflammation. Small hiatal hernia. Stomach otherwise unremarkable. Normal appendix visualized. Vascular/Lymphatic: Atherosclerotic disease is noted throughout a normal caliber abdominal aorta extending into the iliac vessels. There are vascular collaterals in the upper abdomen with a patent paraumbilical vein and collaterals along the lesser curvature of the stomach. These findings are stable from the prior CT. Reproductive: Prostate is enlarged bulging into the bladder base, unchanged from the prior CT. Other: Moderate ascites distends the abdomen increased from the prior CT. Musculoskeletal: No fracture or acute finding. No osteoblastic or osteolytic lesions. IMPRESSION: 1. Moderate ascites distends the abdomen. This has increased when compared to the prior CT, and accounts for abdominal distention. 2. Findings consistent with cirrhosis, with superimposed hepatic  steatosis, and portal venous hypertension reflected by venous collaterals and ascites. Liver appearance is stable from the prior study. 3. Thick walled bladder with diverticulum, along with an enlarged prostate. This is stable from the prior CT and suggests chronic bladder outlet obstruction. 4. Aortic atherosclerosis. Electronically Signed   By: Amie Portland M.D.   On: 03/20/2017 14:01   Mr Liver W Wo Contrast  Result Date:  03/09/2017 CLINICAL DATA:  Abnormal liver function tests. Recently diagnosed with cirrhosis. Recent paracentesis. EXAM: MRI ABDOMEN WITHOUT AND WITH CONTRAST TECHNIQUE: Multiplanar multisequence MR imaging of the abdomen was performed both before and after the administration of intravenous contrast. CONTRAST:  15 cc MultiHance COMPARISON:  03/08/2017 abdominal ultrasound.  01/16/2017 CT FINDINGS: Mild to moderate motion degradation throughout. Lower chest: Normal heart size without pericardial or pleural effusion. Hepatobiliary: Hepatomegaly, 20.0 cm craniocaudal. No significant steatosis. No definite evidence of cirrhosis. Normal gallbladder, without biliary ductal dilatation. Pancreas:  Normal, without mass or ductal dilatation. Spleen:  Normal in size, without focal abnormality. Adrenals/Urinary Tract: Normal adrenal glands. Normal kidneys, without hydronephrosis. Stomach/Bowel: Normal stomach and abdominal bowel loops. Vascular/Lymphatic: Aortic and branch vessel atherosclerosis. Patent portal and splenic veins. Other: Small volume ascites, relatively similar to 01/16/2017. Anasarca. Musculoskeletal: No acute osseous abnormality. IMPRESSION: 1. Motion degradation. 2. Hepatomegaly. No definite explanation for elevated liver function tests. 3. Small volume ascites, similar. Electronically Signed   By: Jeronimo Greaves M.D.   On: 03/09/2017 12:10   US Abdomen Limited  Result Date: 03/18/2017 CLINICAL DATA:  Cirrhosis. Ascites. Last paracentesis of 03/16/2017. EXAM: LIMITED ABDOMEN ULTRASOUND FOR ASCITES TECHNIQUE: Limited ultrasound survey for ascites was performed in all four abdominal quadrants. COMPARISON:  03/16/2017 FINDINGS: Moderate ascites distends the abdomen, noted in all 4 quadrants. IMPRESSION: Moderate ascites throughout the abdomen. Electronically Signed   By: Amie Portland M.D.   On: 03/18/2017 09:42   US Paracentesis  Result Date: 03/24/2017 INDICATION: History of cirrhosis, now with  recurrent symptomatic ascites. EXAM: ULTRASOUND-GUIDED PARACENTESIS COMPARISON:  Ultrasound-guided paracentesis- 03/19/2017; 03/16/2017 MEDICATIONS: None. COMPLICATIONS: None immediate. TECHNIQUE: Informed written consent was obtained from the patient after a discussion of the risks, benefits and alternatives to treatment. A timeout was performed prior to the initiation of the procedure. Initial ultrasound scanning demonstrates a moderate amount of ascites within the right lower abdominal quadrant. The right lower abdomen was prepped and draped in the usual sterile fashion. 1% lidocaine with epinephrine was used for local anesthesia. An ultrasound image was saved for documentation purposed. An 8 Fr Safe-T-Centesis catheter was introduced. The paracentesis was performed. The catheter was removed and a dressing was applied. The patient tolerated the procedure well without immediate post procedural complication. FINDINGS: A total of approximately 3.5 liters of serous fluid was removed. IMPRESSION: Successful ultrasound-guided paracentesis yielding 3.5 liters of peritoneal fluid. Electronically Signed   By: Simonne Come M.D.   On: 03/24/2017 14:30   US Paracentesis  Result Date: 03/19/2017 INDICATION: Abdominal distention. Recurrent ascites. Request diagnostic therapeutic paracentesis. EXAM: ULTRASOUND GUIDED RIGHT LOWER QUADRANT PARACENTESIS MEDICATIONS: None. COMPLICATIONS: None immediate. PROCEDURE: Informed written consent was obtained from the patient after a discussion of the risks, benefits and alternatives to treatment. A timeout was performed prior to the initiation of the procedure. Initial ultrasound scanning demonstrates a large amount of ascites within the right lower abdominal quadrant. The right lower abdomen was prepped and draped in the usual sterile fashion. 1% lidocaine with epinephrine was used for local anesthesia. Following this, a Safe-T-Centesis catheter was introduced.  An ultrasound image  was saved for documentation purposes. The paracentesis was performed. The catheter was removed and a dressing was applied. The patient tolerated the procedure well without immediate post procedural complication. FINDINGS: A total of approximately 3.8 L of clear yellow fluid was removed. Samples were sent to the laboratory as requested by the clinical team. IMPRESSION: Successful ultrasound-guided paracentesis yielding 3.8 liters of peritoneal fluid. Read by: Brayton El PA-C Electronically Signed   By: Irish Lack M.D.   On: 03/19/2017 14:32   US Paracentesis  Result Date: 03/16/2017 INDICATION: Cirrhosis and ascites. EXAM: ULTRASOUND GUIDED PARACENTESIS MEDICATIONS: None. COMPLICATIONS: None immediate. PROCEDURE: Informed written consent was obtained from the patient after a discussion of the risks, benefits and alternatives to treatment. A timeout was performed prior to the initiation of the procedure. Initial ultrasound scanning was performed to localize ascites. The left lower abdomen was prepped and draped in the usual sterile fashion. 1% lidocaine was used for local anesthesia. Following this, a 6 Fr Safe-T-Centesis catheter was introduced. An ultrasound image was saved for documentation purposes. The paracentesis was performed. The catheter was removed and a dressing was applied. The patient tolerated the procedure well without immediate post procedural complication. FINDINGS: A total of approximately 4.2 L of yellow fluid was removed. IMPRESSION: Successful ultrasound-guided paracentesis yielding 4.2 liters of peritoneal fluid. Electronically Signed   By: Irish Lack M.D.   On: 03/16/2017 10:46   US Abdominal Pelvic Art/vent Flow Doppler  Result Date: 03/18/2017 CLINICAL DATA:  Ascites. EXAM: DUPLEX ULTRASOUND OF LIVER TECHNIQUE: Color and duplex Doppler ultrasound was performed to evaluate the hepatic in-flow and out-flow vessels. COMPARISON:  None. FINDINGS: Portal Vein Velocities Main:   26 cm/sec Right:  7 cm/sec Left:  8 cm/sec Hepatic Vein Velocities Right:  22 cm/sec Middle:  12 cm/sec Left:  6 cm/sec Hepatic Artery Velocity:  260 cm/sec Splenic Vein Velocity:  21 cm/sec Varices: None identified. Ascites: Small to moderate volume ascites present. There is no evidence of portal vein thrombus in the main portal vein appears normal in caliber. Portal vein flow is hepatopetal. Although the main portal vein velocity is not significantly reduced, intrahepatic portal vein velocity does appear to be significantly reduced and elevated hepatic artery velocities support underlying portal hypertension. Portal vein waveforms are somewhat pulsatile in appearance at the level of the main portal vein. However, there does appear to be some likely interference with adjacent hepatic arterial transmission. Intrahepatic portal vein waveforms are not pulsatile. Pulsatile portal flow can be associated with right-sided heart failure, tricuspid regurgitation and cirrhosis with arterioportal shunting. No evidence of splenic vein thrombosis. No evidence of hepatic veno-occlusive disease. Venous waveforms are normal. IMPRESSION: Although main portal vein velocities are normal, significant reduction in intrahepatic portal vein velocities and corresponding high velocity in the hepatic artery are suggestive of some degree of underlying portal hypertension. No evidence of portal vein thrombus, reversed portal vein flow or portal vein dilatation. Electronically Signed   By: Irish Lack M.D.   On: 03/18/2017 15:28   US Abdomen Limited Ruq  Result Date: 03/08/2017 CLINICAL DATA:  Elevated bilirubin. EXAM: ULTRASOUND ABDOMEN LIMITED RIGHT UPPER QUADRANT COMPARISON:  CT abdomen 01/16/2017. FINDINGS: Gallbladder: Thick-walled gallbladder, up to 4.6 mm, without visible stones or sludge. Negative sonographic Murphy's sign. No pericholecystic fluid. Common bile duct: Diameter: Upper limits normal, 5.5 mm. Liver: Nodular  contour of the liver, with increased echotexture, suggesting cirrhosis. No focal lesions. Portal vein is patent on color Doppler imaging with normal  direction of blood flow towards the liver. Incidental note is made of moderate ascites. IMPRESSION: Findings consistent with cirrhosis of the liver, with probable secondary ascites. No gallstones, but thick-walled gallbladder, almost 5 mm. No biliary ductal dilatation. Electronically Signed   By: Elsie Stain M.D.   On: 03/08/2017 18:48    Micro Results     No results found for this or any previous visit (from the past 240 hour(s)).     Today   Subjective:   Dereck Ligas today has no headache,no chest abdominal pain,no new weakness tingling or numbness, feels much better wants to go home today.   Objective:   Blood pressure (!) 82/50, pulse 91, temperature 97.8 F (36.6 C), temperature source Oral, resp. rate 16, height 5\' 10"  (1.778 m), weight 72 kg (158 lb 12.8 oz), SpO2 100 %.  No intake or output data in the 24 hours ending 03/30/17 1235  Exam Awake Alert, Oriented x 3, No new F.N deficits, Normal affect [place].AT,PERRAL Supple Neck,No JVD, No cervical lymphadenopathy appriciated.  Symmetrical Chest wall movement, Good air movement bilaterally, CTAB RRR,No Gallops,Rubs or new Murmurs, No Parasternal Heave +ve B.Sounds, Abd Soft, Non tender, No organomegaly appriciated, No rebound -guarding or rigidity. No Cyanosis, Clubbing or edema, No new Rash or bruise  Data Review   CBC w Diff:  Lab Results  Component Value Date   WBC 7.6 03/23/2017   HGB 12.4 (L) 03/23/2017   HCT 35.8 (L) 03/23/2017   PLT 201 03/23/2017   LYMPHOPCT 41 03/17/2017   MONOPCT 15 03/17/2017   EOSPCT 1 03/17/2017   BASOPCT 1 03/17/2017    CMP:  Lab Results  Component Value Date   NA 131 (L) 03/24/2017   K 4.2 03/24/2017   CL 96 (L) 03/24/2017   CO2 25 03/24/2017   BUN 15 03/24/2017   CREATININE 0.83 03/24/2017   PROT 6.5 03/21/2017   ALBUMIN  3.0 (L) 03/21/2017   BILITOT 7.3 (H) 03/21/2017   ALKPHOS 694 (H) 03/21/2017   AST 72 (H) 03/21/2017   ALT 33 03/21/2017  .   Total Time in preparing paper work, data evaluation and todays exam - 35 minutes  Katha Hamming M.D on 03/27/2017 at 12:35 PM    Note: This dictation was prepared with Dragon dictation along with smaller phrase technology. Any transcriptional errors that result from this process are unintentional.

## 2017-04-03 ENCOUNTER — Other Ambulatory Visit: Payer: Self-pay | Admitting: Radiology

## 2017-04-03 DIAGNOSIS — N3289 Other specified disorders of bladder: Secondary | ICD-10-CM

## 2017-04-03 DIAGNOSIS — N138 Other obstructive and reflux uropathy: Secondary | ICD-10-CM

## 2017-04-03 DIAGNOSIS — R310 Gross hematuria: Secondary | ICD-10-CM

## 2017-04-03 DIAGNOSIS — N401 Enlarged prostate with lower urinary tract symptoms: Secondary | ICD-10-CM

## 2017-04-04 ENCOUNTER — Telehealth: Payer: Self-pay | Admitting: Radiology

## 2017-04-04 ENCOUNTER — Other Ambulatory Visit: Payer: Medicaid Other

## 2017-04-04 NOTE — Telephone Encounter (Signed)
Multiple calls to pt & [facility] need [facility] urine culture prior to surgery as well as clearance from pt's cardiologist. Pt has no showed for cardiology appt on 2 occasions. Will continue to attempt to contact pt.

## 2017-04-05 ENCOUNTER — Inpatient Hospital Stay: Admission: RE | Admit: 2017-04-05 | Payer: Medicaid Other | Source: Ambulatory Visit

## 2017-04-06 ENCOUNTER — Encounter: Payer: Self-pay | Admitting: Radiology

## 2017-04-06 NOTE — Telephone Encounter (Signed)
[facility] regarding rescheduling surgery d/t need for cardiac clearance, pre-admit testing appt & ucx prior to surgery. Unable to reach pt or Kym Labus. Letter mailed to pt.

## 2017-04-10 ENCOUNTER — Encounter: Admission: RE | Payer: Self-pay | Source: Ambulatory Visit

## 2017-04-10 ENCOUNTER — Ambulatory Visit: Admission: RE | Admit: 2017-04-10 | Payer: Medicaid Other | Source: Ambulatory Visit | Admitting: Urology

## 2017-04-10 SURGERY — CYSTOSCOPY, WITH BIOPSY
Anesthesia: Choice

## 2017-04-12 NOTE — Telephone Encounter (Signed)
Spoke with Kym Starry who states [place] [place] [facility] [place] & the family has been called in d/t decreasing blood pressure. Offered condolences & encouragement. Kym gives appreciation of efforts to help pt.

## 2017-04-13 ENCOUNTER — Telehealth: Payer: Self-pay | Admitting: Urology

## 2017-04-14 NOTE — Telephone Encounter (Signed)
So sorry to hear that.    Patrick ScotlandAshley Kenston Longton, MD

## 2017-04-28 NOTE — Telephone Encounter (Signed)
Pt sister [name] called office to let you know that Mr. Kornegay was found laying on floor unresponsive, not sure for how long. [facility] and brain damage was diagnosed, pnuemonia, blood clot in GI area. 2 emergency surgeries. Pt was placed on life support and family was called in to make a decision, family decided to take Mr. Eley off life support and is only being given comfort measures at this point. Kim just wanted to let you know.

## 2017-04-28 DEATH — deceased

## 2017-05-23 ENCOUNTER — Ambulatory Visit: Admit: 2017-05-23 | Payer: Medicaid Other | Admitting: Gastroenterology

## 2017-05-23 SURGERY — ESOPHAGOGASTRODUODENOSCOPY (EGD) WITH PROPOFOL
Anesthesia: General

## 2018-02-24 IMAGING — CT CT ABD-PEL WO/W CM
3 of 11 series · 14 of 46 positions shown, 16 images · IV contrast (iopamidol)
Comparison: No priors.

CLINICAL DATA: 61-year-old male with history of abdominal pain
common nausea, vomiting and abdominal distention. Elevated liver
function tests.

EXAM:
CT ABDOMEN AND PELVIS WITHOUT AND WITH CONTRAST
TECHNIQUE: Multidetector CT imaging of the abdomen and pelvis was performed
following the standard protocol before and following the bolus
administration of intravenous contrast.
CONTRAST:  100mL [id number] IOPAMIDOL ([id number]) INJECTION 61%

[Series 3: coronal arterial · coronal · arterial · 0.56mm/px · 3 of 94 slices shown]
[im 24/94  soft-tissue]
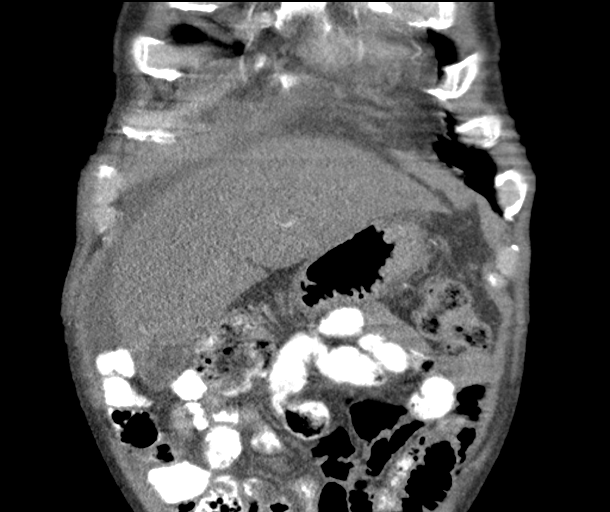
[im 47/94  soft-tissue]
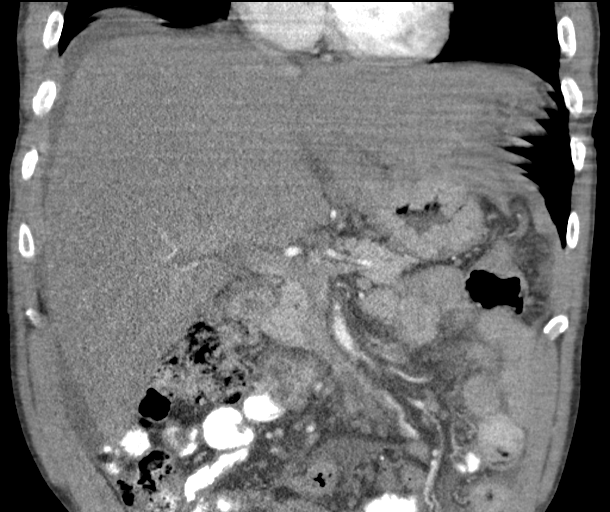
[im 70/94  soft-tissue]
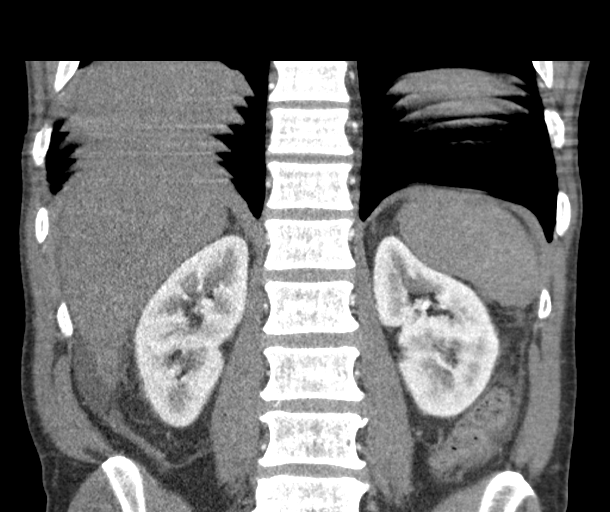

[Series 7: axial venous · axial · portal-venous · 0.66mm/px · z∈[-965,-584]mm · 9 of 159 slices shown, 11 images]
[im 16/159  soft-tissue]
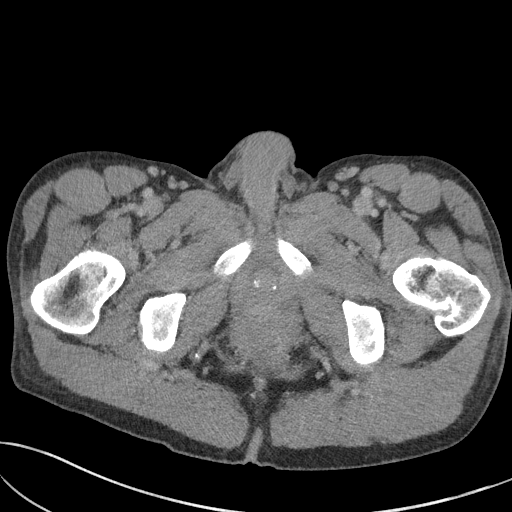
[im 16/159  bone]
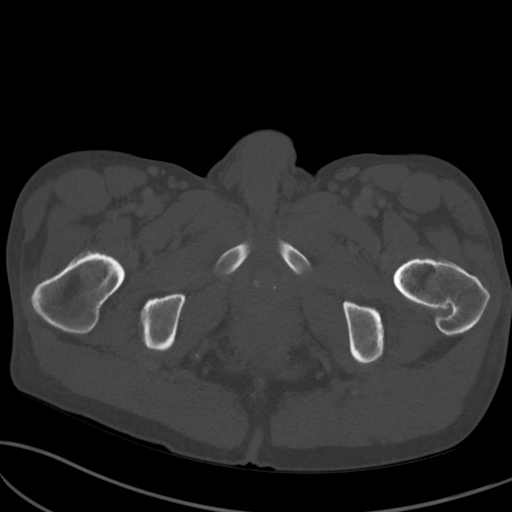
[im 32/159  soft-tissue]
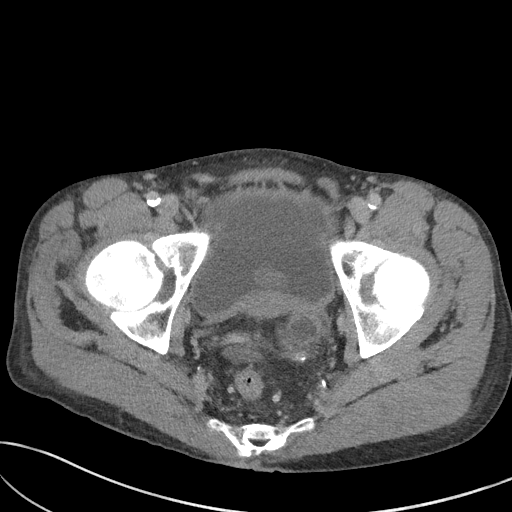
[im 48/159  soft-tissue]
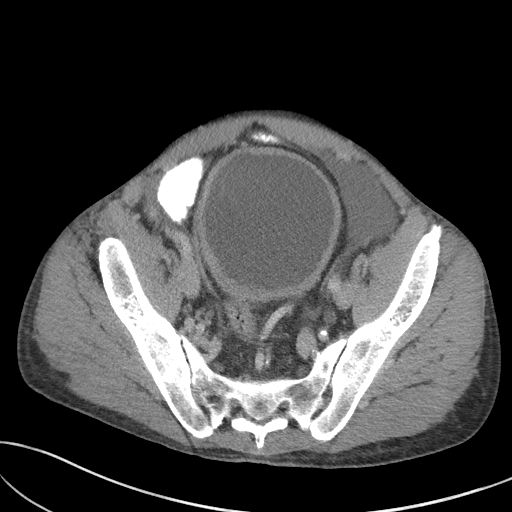
[im 64/159  soft-tissue]
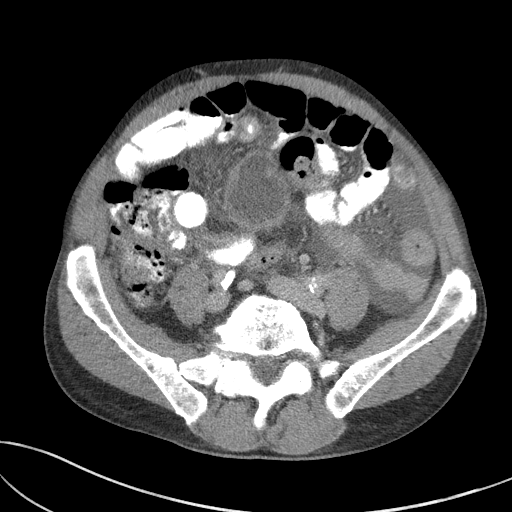
[im 80/159  soft-tissue]
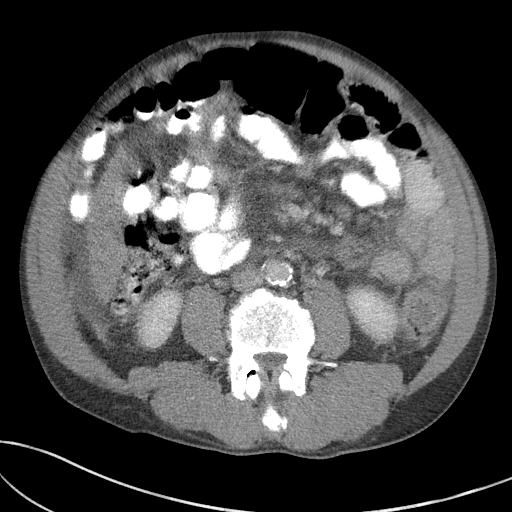
[im 95/159  soft-tissue]
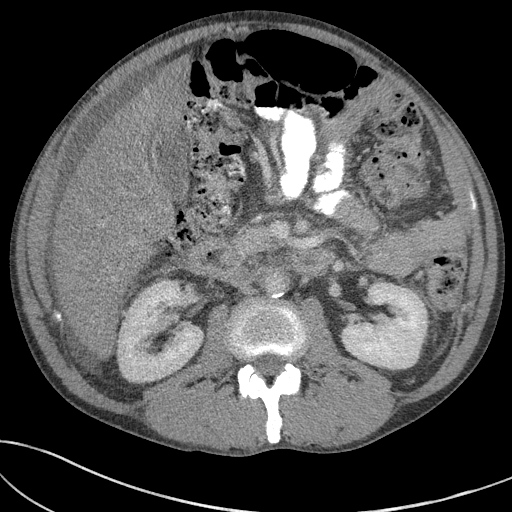
[im 111/159  soft-tissue]
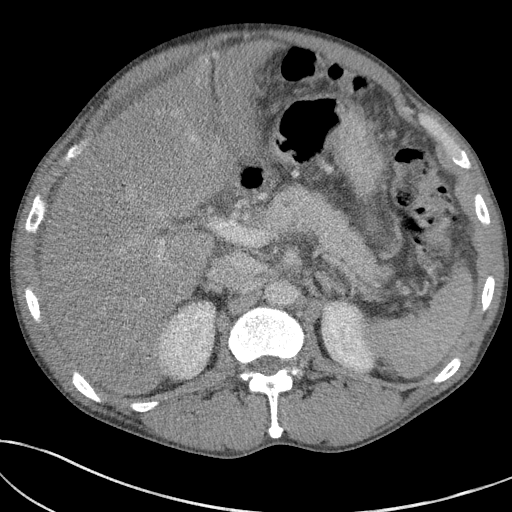
[im 127/159  soft-tissue]
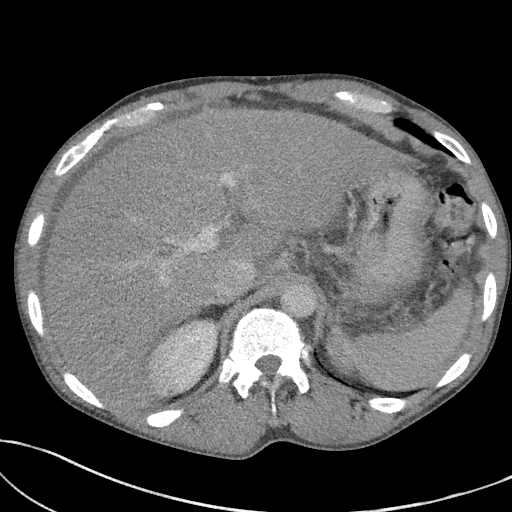
[im 143/159  soft-tissue]
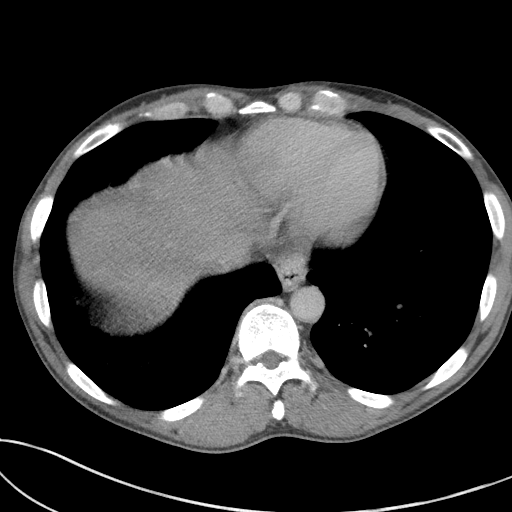
[im 143/159  bone]
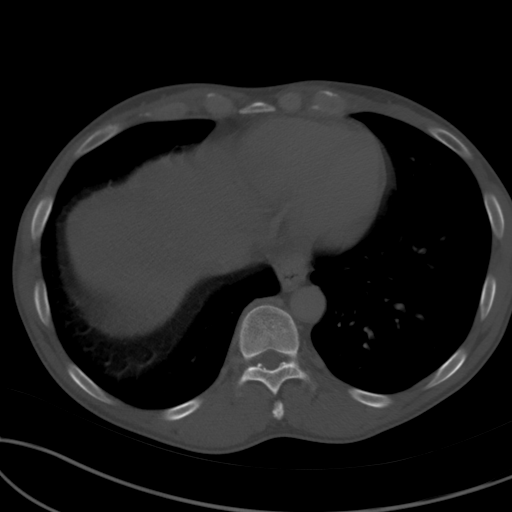

[Series 14: axial delay · axial · delayed · 0.66mm/px · z∈[-722,-622]mm · 2 of 61 slices shown]
[im 21/61  soft-tissue]
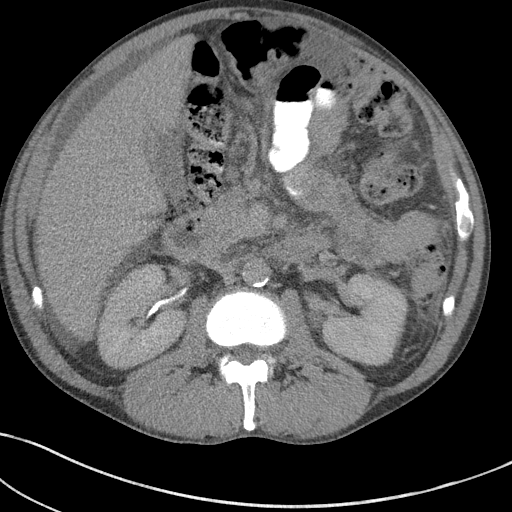
[im 41/61  soft-tissue]
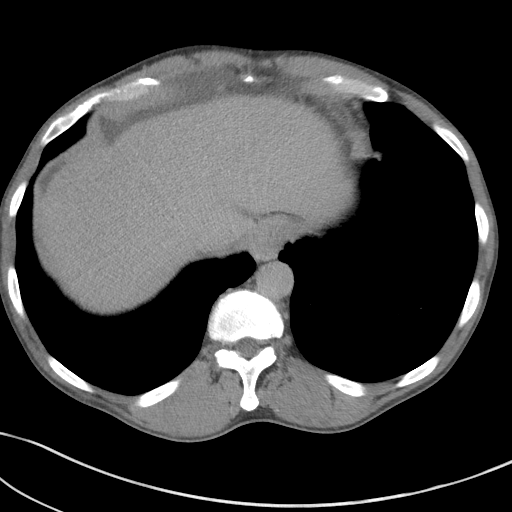

[14 of 46 positions shown; findings below may reference images not displayed]

FINDINGS: Comment: Today's study is limited by considerable patient
respiratory motion.

Lower chest: Unremarkable.

Hepatobiliary: Mild diffuse low attenuation throughout the hepatic
parenchyma, indicative of a background of hepatic steatosis.
Accounting for today's motion limited examination, no definite
suspicious cystic or solid hepatic lesions are noted. The liver has
a shrunken appearance and nodular contour, indicative of underlying
cirrhosis. No intra or extrahepatic biliary ductal dilatation.
Gallbladder is normal in appearance.

Pancreas: No pancreatic mass. No pancreatic ductal dilatation. No
pancreatic or peripancreatic fluid or inflammatory changes.

Spleen: Unremarkable.

Adrenals/Urinary Tract: Bilateral kidneys and bilateral adrenal
glands are normal in appearance. No hydroureteronephrosis. Urinary
bladder is thick walled. Large diverticulum adjacent to the left
ureterovesicular junction measuring up to 3.6 cm in diameter, with a
small focus of mural thickening (axial image 125 of series 7)
laterally. Small diverticulum in the superior aspect of the urinary
bladder measuring 2.7 cm in diameter. Additionally, on the left side
of the urinary bladder there is a small mural nodule measuring
approximately 11 mm which has a small focus of calcification
associated with it (axial image 118 of series 7).

Stomach/Bowel: The appearance of the stomach is normal. There is no
pathologic dilatation of small bowel or colon. The appendix is not
confidently identified and may be surgically absent. Regardless,
there are no inflammatory changes noted adjacent to the cecum to
suggest the presence of an acute appendicitis at this time.

Vascular/Lymphatic: Aortic atherosclerosis, without evidence of
aneurysm or dissection in the abdominal or pelvic vasculature. No
lymphadenopathy noted in the abdomen or pelvis.

Reproductive: Severe median lobe hypertrophy of the prostate gland.
Seminal vesicles are unremarkable in appearance.

Other: Small volume of ascites, most evident adjacent to the liver
and spleen. No pneumoperitoneum.

Musculoskeletal: There are no aggressive appearing lytic or blastic
lesions noted in the visualized portions of the skeleton.
IMPRESSION: 1. Morphologic changes in the liver compatible with underlying
cirrhosis. There is also some mild hepatic steatosis. At this time,
there is no discrete hepatic lesion identified. Additionally, there
is no evidence of biliary tract obstruction.
2. Small volume of ascites.
3. Aortic atherosclerosis.
4. Markedly thickened urinary bladder, with 2 diverticulae and 1
focus of mural nodularity which appears to have some internal
calcification, as above. Urologic consultation is recommended in the
near future to better evaluate these findings, as a possibility of
urothelial neoplasm is not excluded.
Aortic Atherosclerosis (KI83M-449.9).

## 2019-11-30 IMAGING — US US ABDOMEN LIMITED
1 series · 14 of 25 positions shown · non-contrast
Comparison: CT abdomen 01/16/2017.

CLINICAL DATA: Elevated bilirubin.

EXAM:
ULTRASOUND ABDOMEN LIMITED RIGHT UPPER QUADRANT

[Series 1: us abdomen limited · 0.26mm/px · 14 of 49 slices shown]
[im 1/49]
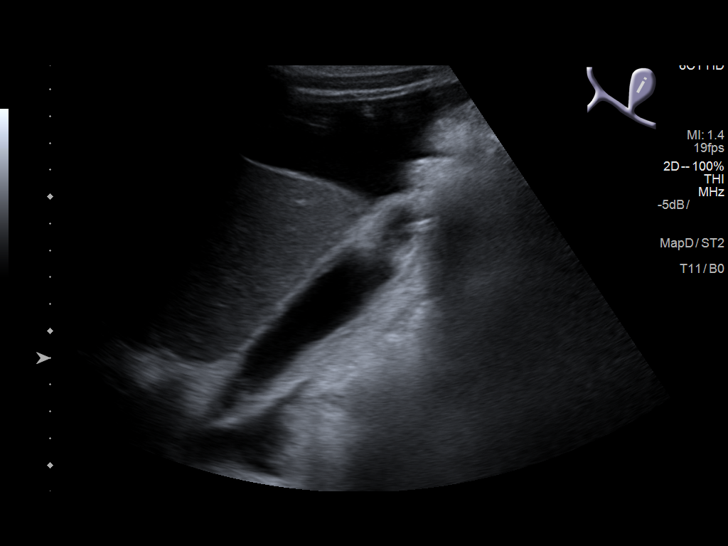
[im 5/49]
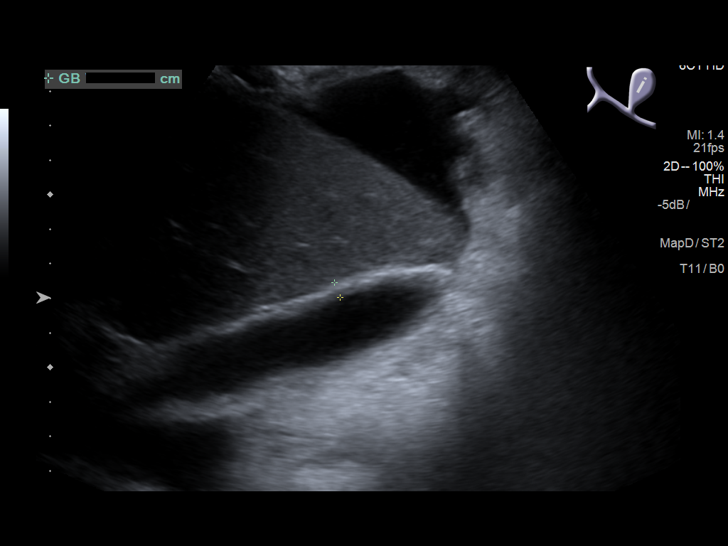
[im 9/49]
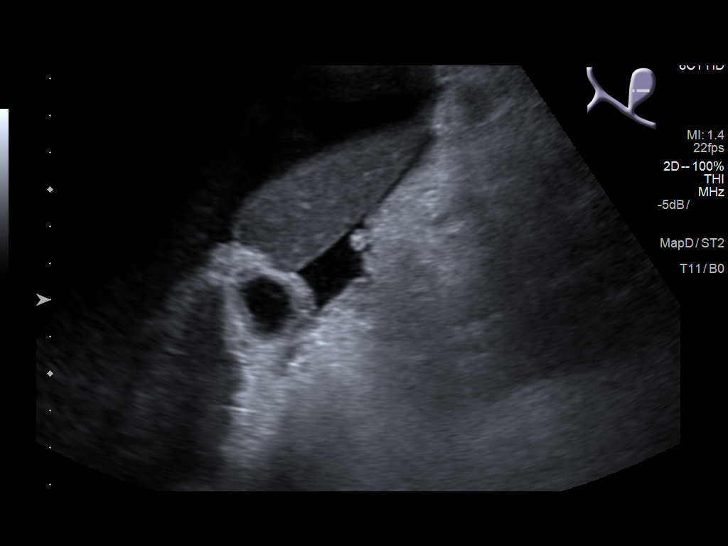
[im 13/49]
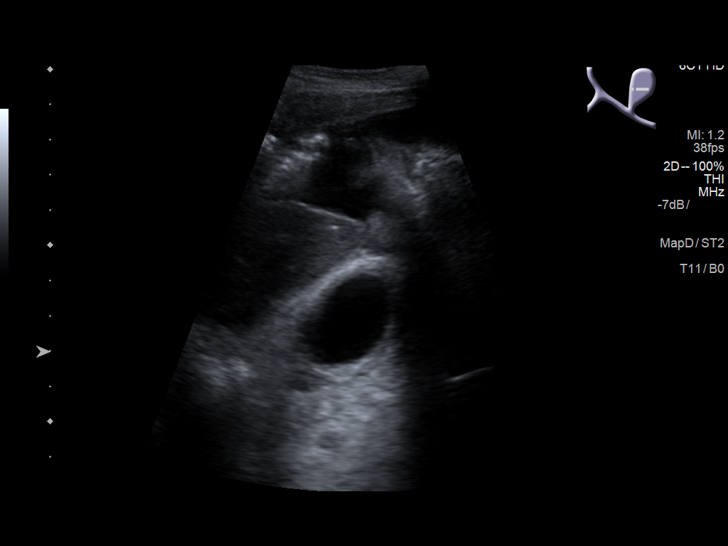
[im 17/49]
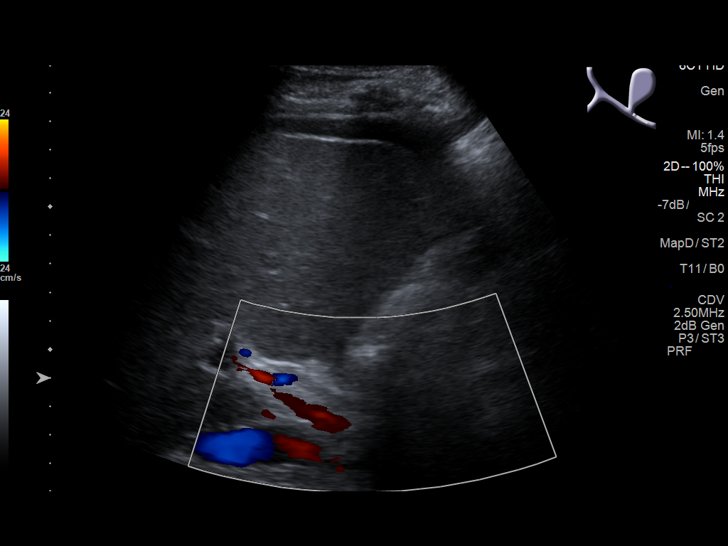
[im 19/49]
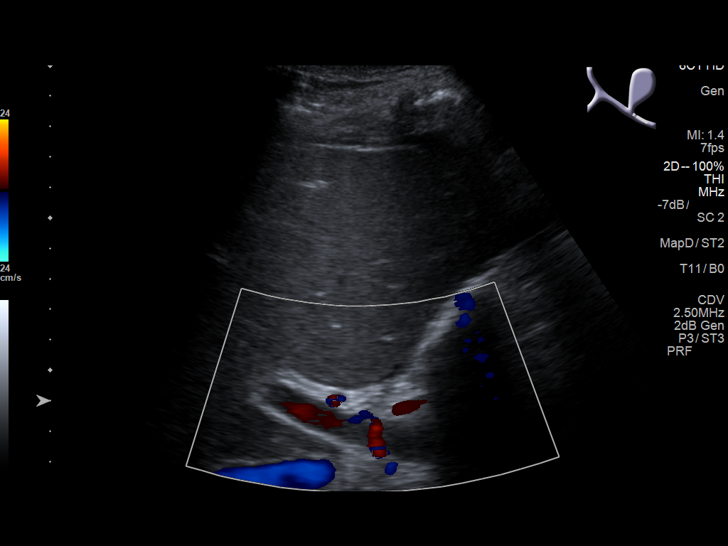
[im 23/49]
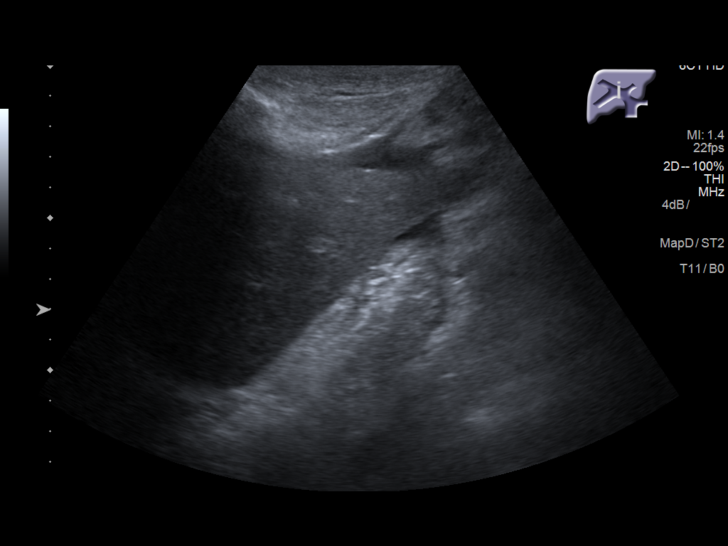
[im 27/49]
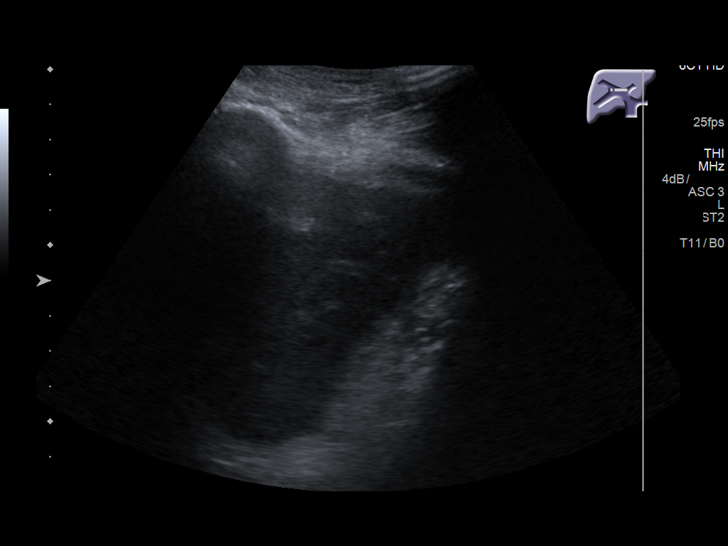
[im 31/49]
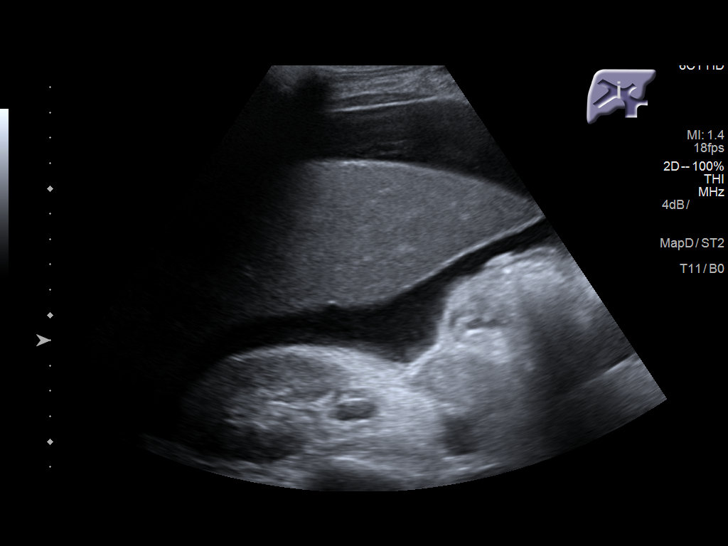
[im 33/49]
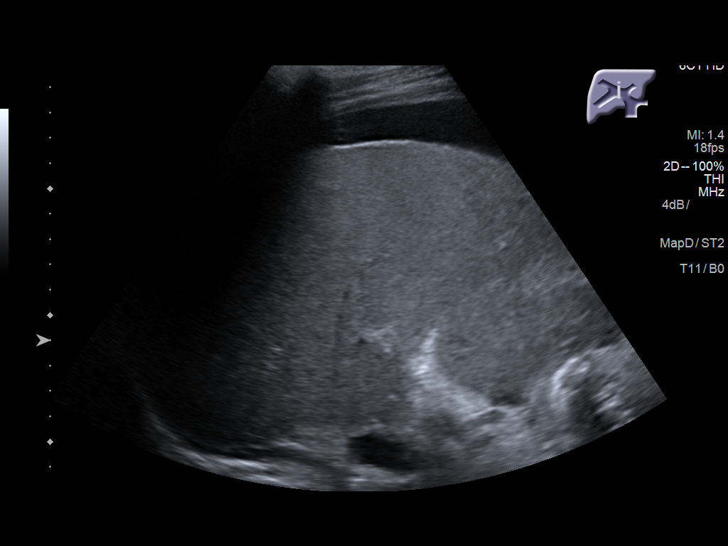
[im 37/49]
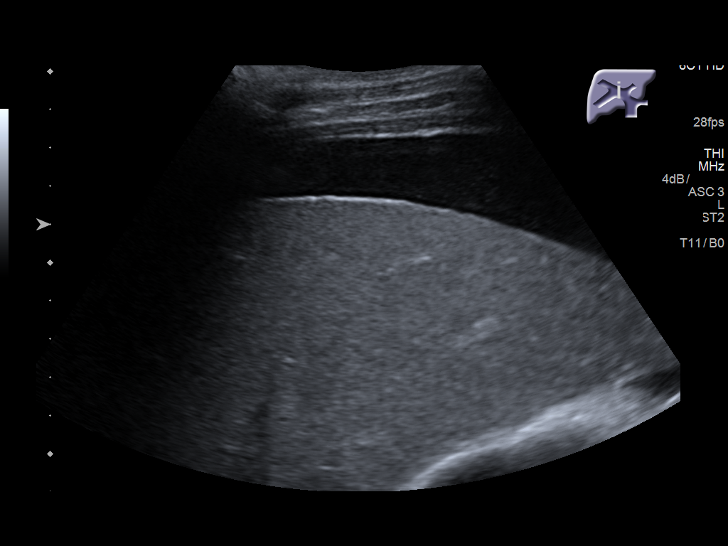
[im 41/49]
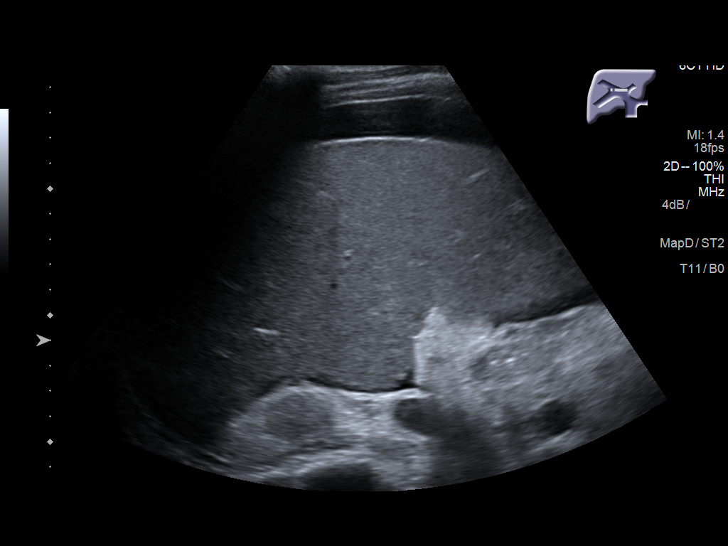
[im 45/49]
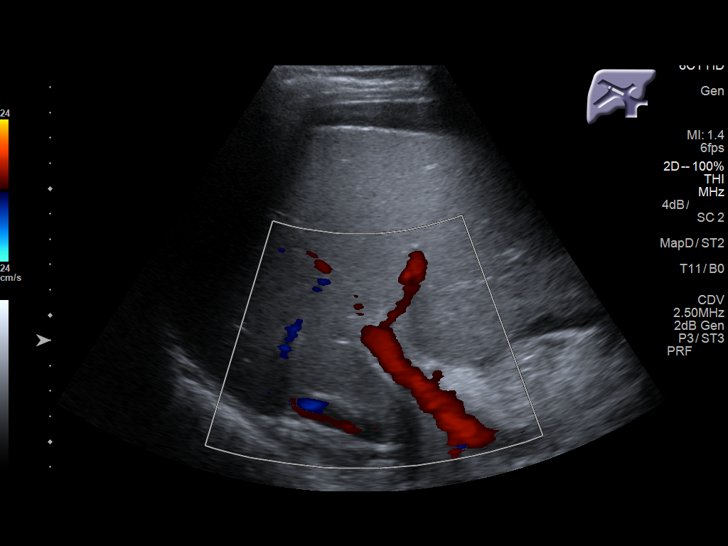
[im 49/49]
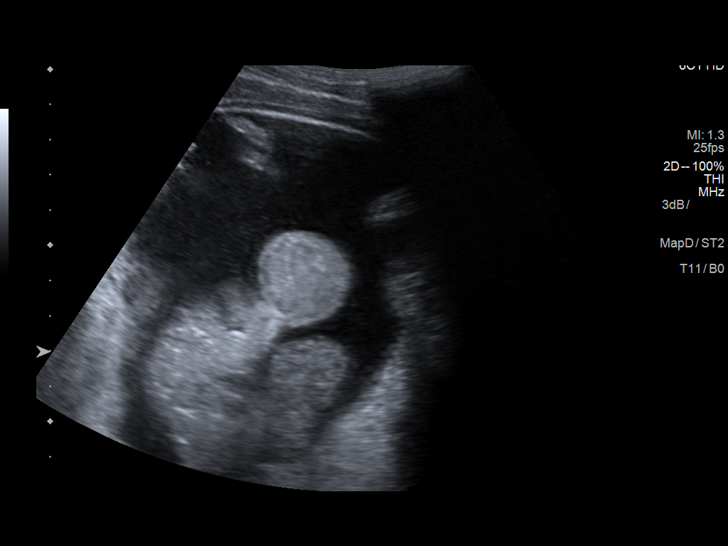

[14 of 25 positions shown; findings below may reference images not displayed]

FINDINGS: Gallbladder:

Thick-walled gallbladder, up to 4.6 mm, without visible stones or
sludge. Negative sonographic Murphy's sign. No pericholecystic
fluid.

Common bile duct:

Diameter: Upper limits normal, 5.5 mm.

Liver:

Nodular contour of the liver, with increased echotexture, suggesting
cirrhosis. No focal lesions. Portal vein is patent on color Doppler
imaging with normal direction of blood flow towards the liver.

Incidental note is made of moderate ascites.
IMPRESSION: Findings consistent with cirrhosis of the liver, with probable
secondary ascites.

No gallstones, but thick-walled gallbladder, almost 5 mm.

No biliary ductal dilatation.
